# Patient Record
Sex: Female | Born: 1945 | Race: White | Hispanic: No | Marital: Married | Smoking: Never smoker
Health system: Southern US, Community
[De-identification: ages and names within clinical notes are randomized; demographics above are authoritative.]

## PROBLEM LIST (undated history)

## (undated) DIAGNOSIS — F32A Depression, unspecified: Secondary | ICD-10-CM

## (undated) DIAGNOSIS — F419 Anxiety disorder, unspecified: Secondary | ICD-10-CM

## (undated) DIAGNOSIS — T4145XA Adverse effect of unspecified anesthetic, initial encounter: Secondary | ICD-10-CM

## (undated) DIAGNOSIS — C801 Malignant (primary) neoplasm, unspecified: Secondary | ICD-10-CM

## (undated) DIAGNOSIS — T8859XA Other complications of anesthesia, initial encounter: Secondary | ICD-10-CM

## (undated) DIAGNOSIS — R519 Headache, unspecified: Secondary | ICD-10-CM

## (undated) DIAGNOSIS — J449 Chronic obstructive pulmonary disease, unspecified: Secondary | ICD-10-CM

## (undated) DIAGNOSIS — E039 Hypothyroidism, unspecified: Secondary | ICD-10-CM

## (undated) DIAGNOSIS — R112 Nausea with vomiting, unspecified: Secondary | ICD-10-CM

## (undated) DIAGNOSIS — M1990 Unspecified osteoarthritis, unspecified site: Secondary | ICD-10-CM

## (undated) DIAGNOSIS — G4730 Sleep apnea, unspecified: Secondary | ICD-10-CM

## (undated) DIAGNOSIS — I10 Essential (primary) hypertension: Secondary | ICD-10-CM

## (undated) DIAGNOSIS — L409 Psoriasis, unspecified: Secondary | ICD-10-CM

## (undated) DIAGNOSIS — F329 Major depressive disorder, single episode, unspecified: Secondary | ICD-10-CM

## (undated) DIAGNOSIS — Z9889 Other specified postprocedural states: Secondary | ICD-10-CM

## (undated) DIAGNOSIS — I499 Cardiac arrhythmia, unspecified: Secondary | ICD-10-CM

## (undated) DIAGNOSIS — Z22322 Carrier or suspected carrier of Methicillin resistant Staphylococcus aureus: Secondary | ICD-10-CM

## (undated) HISTORY — PX: BREAST EXCISIONAL BIOPSY: SUR124

## (undated) HISTORY — PX: CHOLECYSTECTOMY: SHX55

## (undated) HISTORY — PX: EYE SURGERY: SHX253

---
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
## 1968-02-21 HISTORY — PX: BREAST SURGERY: SHX581

## 2003-02-21 HISTORY — PX: JOINT REPLACEMENT: SHX530

## 2003-11-30 ENCOUNTER — Other Ambulatory Visit: Payer: Self-pay

## 2003-12-16 ENCOUNTER — Inpatient Hospital Stay: Payer: Self-pay | Admitting: General Practice

## 2004-05-03 ENCOUNTER — Ambulatory Visit: Payer: Self-pay | Admitting: Psychiatry

## 2004-05-16 ENCOUNTER — Ambulatory Visit: Payer: Self-pay | Admitting: Psychiatry

## 2004-07-03 ENCOUNTER — Ambulatory Visit: Payer: Self-pay | Admitting: Psychiatry

## 2004-07-05 ENCOUNTER — Ambulatory Visit: Payer: Self-pay | Admitting: Internal Medicine

## 2005-07-11 ENCOUNTER — Ambulatory Visit: Payer: Self-pay | Admitting: Internal Medicine

## 2006-02-20 HISTORY — PX: JOINT REPLACEMENT: SHX530

## 2006-05-01 ENCOUNTER — Other Ambulatory Visit: Payer: Self-pay

## 2006-05-01 ENCOUNTER — Ambulatory Visit: Payer: Self-pay | Admitting: General Practice

## 2006-05-09 ENCOUNTER — Ambulatory Visit: Payer: Self-pay | Admitting: General Practice

## 2006-05-15 ENCOUNTER — Inpatient Hospital Stay: Payer: Self-pay | Admitting: General Practice

## 2006-08-15 ENCOUNTER — Ambulatory Visit: Payer: Self-pay | Admitting: Internal Medicine

## 2007-08-20 ENCOUNTER — Ambulatory Visit: Payer: Self-pay | Admitting: Internal Medicine

## 2007-10-24 ENCOUNTER — Ambulatory Visit: Payer: Self-pay | Admitting: Unknown Physician Specialty

## 2008-08-21 ENCOUNTER — Ambulatory Visit: Payer: Self-pay | Admitting: Internal Medicine

## 2009-10-05 ENCOUNTER — Ambulatory Visit: Payer: Self-pay | Admitting: Internal Medicine

## 2010-10-05 ENCOUNTER — Ambulatory Visit: Payer: Self-pay | Admitting: Internal Medicine

## 2010-10-12 ENCOUNTER — Ambulatory Visit: Payer: Self-pay | Admitting: Internal Medicine

## 2010-10-22 ENCOUNTER — Ambulatory Visit: Payer: Self-pay | Admitting: Internal Medicine

## 2010-11-21 ENCOUNTER — Ambulatory Visit: Payer: Self-pay | Admitting: Internal Medicine

## 2011-01-05 ENCOUNTER — Ambulatory Visit: Payer: Self-pay | Admitting: Internal Medicine

## 2011-01-21 ENCOUNTER — Ambulatory Visit: Payer: Self-pay | Admitting: Internal Medicine

## 2011-02-21 HISTORY — PX: JOINT REPLACEMENT: SHX530

## 2011-05-31 ENCOUNTER — Ambulatory Visit: Payer: Self-pay | Admitting: General Practice

## 2011-05-31 LAB — URINALYSIS, COMPLETE
Bacteria: NONE SEEN
Bilirubin,UR: NEGATIVE
Blood: NEGATIVE
Glucose,UR: NEGATIVE mg/dL (ref 0–75)
Leukocyte Esterase: NEGATIVE
Ph: 7 (ref 4.5–8.0)
Protein: NEGATIVE
Specific Gravity: 1.016 (ref 1.003–1.030)
Squamous Epithelial: <1
WBC UR: 1 /HPF (ref 0–5)

## 2011-05-31 LAB — BASIC METABOLIC PANEL
BUN: 21 mg/dL — ABNORMAL HIGH (ref 7–18)
Calcium, Total: 9.2 mg/dL (ref 8.5–10.1)
Chloride: 105 mmol/L (ref 98–107)
Creatinine: 0.7 mg/dL (ref 0.60–1.30)
EGFR (African American): >60
EGFR (Non-African Amer.): >60
Osmolality: 289 (ref 275–301)
Potassium: 3.6 mmol/L (ref 3.5–5.1)
Sodium: 144 mmol/L (ref 136–145)

## 2011-05-31 LAB — CBC
HCT: 35.2 % (ref 35.0–47.0)
HGB: 11.7 g/dL — ABNORMAL LOW (ref 12.0–16.0)
MCH: 31.2 pg (ref 26.0–34.0)
RBC: 3.76 10*6/uL — ABNORMAL LOW (ref 3.80–5.20)
RDW: 14.4 % (ref 11.5–14.5)
WBC: 8.5 10*3/uL (ref 3.6–11.0)

## 2011-05-31 LAB — APTT: Activated PTT: 29.7 secs (ref 23.6–35.9)

## 2011-05-31 LAB — PROTIME-INR
INR: 0.9
Prothrombin Time: 12.4 secs (ref 11.5–14.7)

## 2011-06-01 LAB — URINE CULTURE

## 2011-06-14 ENCOUNTER — Inpatient Hospital Stay: Payer: Self-pay | Admitting: General Practice

## 2011-06-15 LAB — BASIC METABOLIC PANEL
Calcium, Total: 8.2 mg/dL — ABNORMAL LOW (ref 8.5–10.1)
Creatinine: 0.64 mg/dL (ref 0.60–1.30)
EGFR (African American): >60
Potassium: 3.8 mmol/L (ref 3.5–5.1)

## 2011-06-15 LAB — HEMOGLOBIN: HGB: 11.1 g/dL — ABNORMAL LOW (ref 12.0–16.0)

## 2011-06-15 LAB — PLATELET COUNT: Platelet: 191 10*3/uL (ref 150–440)

## 2011-06-16 LAB — BASIC METABOLIC PANEL
Calcium, Total: 8.9 mg/dL (ref 8.5–10.1)
Creatinine: 0.64 mg/dL (ref 0.60–1.30)
EGFR (African American): >60
Osmolality: 273 (ref 275–301)
Sodium: 136 mmol/L (ref 136–145)

## 2011-06-16 LAB — HEMOGLOBIN: HGB: 10.6 g/dL — ABNORMAL LOW (ref 12.0–16.0)

## 2011-06-17 LAB — BASIC METABOLIC PANEL
BUN: 17 mg/dL (ref 7–18)
Calcium, Total: 8.5 mg/dL (ref 8.5–10.1)
Chloride: 100 mmol/L (ref 98–107)
Co2: 31 mmol/L (ref 21–32)
Creatinine: 0.57 mg/dL — ABNORMAL LOW (ref 0.60–1.30)
EGFR (African American): >60
Glucose: 121 mg/dL — ABNORMAL HIGH (ref 65–99)
Potassium: 3.5 mmol/L (ref 3.5–5.1)
Sodium: 138 mmol/L (ref 136–145)

## 2011-10-17 ENCOUNTER — Ambulatory Visit: Payer: Self-pay | Admitting: Internal Medicine

## 2012-10-17 ENCOUNTER — Ambulatory Visit: Payer: Self-pay | Admitting: Internal Medicine

## 2013-01-02 ENCOUNTER — Ambulatory Visit: Payer: Self-pay | Admitting: Unknown Physician Specialty

## 2013-01-03 LAB — PATHOLOGY REPORT

## 2013-07-28 DIAGNOSIS — I10 Essential (primary) hypertension: Secondary | ICD-10-CM | POA: Insufficient documentation

## 2013-07-28 DIAGNOSIS — G4730 Sleep apnea, unspecified: Secondary | ICD-10-CM | POA: Insufficient documentation

## 2013-10-23 ENCOUNTER — Ambulatory Visit: Payer: Self-pay | Admitting: Internal Medicine

## 2013-10-28 ENCOUNTER — Ambulatory Visit: Payer: Self-pay | Admitting: Internal Medicine

## 2013-12-25 DIAGNOSIS — E039 Hypothyroidism, unspecified: Secondary | ICD-10-CM | POA: Insufficient documentation

## 2013-12-25 DIAGNOSIS — E785 Hyperlipidemia, unspecified: Secondary | ICD-10-CM | POA: Insufficient documentation

## 2013-12-25 DIAGNOSIS — L409 Psoriasis, unspecified: Secondary | ICD-10-CM | POA: Insufficient documentation

## 2014-04-01 ENCOUNTER — Ambulatory Visit: Payer: Self-pay | Admitting: Ophthalmology

## 2014-04-29 ENCOUNTER — Ambulatory Visit: Payer: Self-pay | Admitting: Ophthalmology

## 2014-06-14 NOTE — Consult Note (Signed)
PATIENT NAME:  Victoria Flynn, Victoria Flynn MR#:  [id number] DATE OF BIRTH:  1945-06-28  DATE OF CONSULTATION:  06/15/2011  REFERRING PHYSICIAN:   CONSULTING PHYSICIAN:  Tana Conch. Leslye Peer, MD  PRIMARY CARE PHYSICIAN: Lisette Grinder, III, MD   ORTHOPEDIC SURGEON: Dr. Marry Guan   REASON FOR CONSULTATION: Malignant hypertension.   HISTORY OF PRESENT ILLNESS: This is a 69 year old female who underwent elective right total knee replacement on 06/14/2011. The patient did well with the surgery, really does not have much pain when I came to see her but, as per the nurse, did have some pain earlier. She did have nausea and vomiting last night and again this morning. The nurse held the blood pressure medications while she was nauseated and vomiting until that settled down and did give the blood pressure medications. Blood pressure was up at 204/90 and after the blood pressure medications 190/86. The patient thinks that her blood pressure went up with the activity with physical therapy. Of note, she was recently placed on Norvasc for blood pressure elevation at the preop consultation and the patient is already on lisinopril, HCT, and atenolol.   PAST MEDICAL HISTORY:  1. Hypertension.  2. Hypothyroidism.  3. Arthritis.  4. Sleep apnea. 5. Psoriasis.  6. Depression.   PAST SURGICAL HISTORY:  1. Cholecystectomy.  2. Bilateral hip replacements. 3. Skin cancer on the chest. 4. Yesterday's right knee replacement.   ALLERGIES: Morphine causes nausea and vomiting and numerous fruits.   MEDICATIONS THAT THE PATIENT IS TAKING ON A DAILY BASIS AS OUTPATIENT:  1. Aspirin 81 mg daily.  2. Atenolol 50 mg daily.  3. Celexa 10 mg daily.  4. Doxazosin 4 mg daily.  5. Enbrel 50 mg/mL one shot subcutaneous once a week. 6. Hydrochlorothiazide/lisinopril 12.5/20 two tablets daily.  7. Levoxyl 150 mcg daily.  8. Melatonin 3 mg 2 to 3 tablets at night. 9. Meloxicam 15 mg at bedtime.  10. Multivitamin daily.  11. Norvasc 5 mg  daily.  12. Os-Cal Plus D 1 mg b.i.d.  13. Protopic 0.1% topical ointment twice a day to eyelids. 14. Taclonex topical ointment 2 to 3 times per week as needed.   MEDICATIONS IN THE HOSPITAL:  1. Tylenol 500 to 1000 mg q.4 hours p.r.n. pain.  2. Antacid double-strength 30 mL q.6 hours p.r.n.  3. Atenolol 50 mg daily. 4. Bisacodyl 10 mg rectally.  5. Calcium and Vitamin D twice a day.  6. Celebrex 200 mg twice a day.  7. Celexa 10 mg daily.  8. Senokot S 1 tablet b.i.d.  9. Anzemet 12.5 mg q.6 hours p.r.n. nausea. 10. Cardura 4 mg daily.  11. Hydrochlorothiazide/lisinopril 12.5/20 2 tablets daily.  12. Levothyroxine 0.15 mg q.6 a.m.  13. Milk of Magnesia 30 mL b.i.d. p.r.n. constipation. 14. Melatonin 6 mg at bedtime.  15. Demerol 25 to 50 mg IV push q.4 hours p.r.n. pain. 16. Reglan 10 mg q.i.d.  17. Multivitamin daily.  18. Taclonex ointment. 19. Oxycodone 5 to 10 mg q.4 hours p.r.n. pain. 20. Protonix 40 mg b.i.d.  21. Tramadol 50 to 100 mg q.4 hours p.r.n. pain. 22. Lovenox 30 mg q.12 hours. 23. Norvasc 5 mg daily.   SOCIAL HISTORY: No smoking. No alcohol. No drug use. Lives with her husband. 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  FAMILY HISTORY: Father died at 91 of hypertensive complications. Mother died at 61 after a fracture of the hip, had pneumonia and heart failure. She also had hypertension. Sister living with  hypertension and hyperlipidemia.   REVIEW OF SYSTEMS: CONSTITUTIONAL: Positive for fatigue. No fever, chills, or sweats. No weight loss. No weight gain. EYES: She does wear glasses. EARS, NOSE, MOUTH, AND THROAT: Positive for sinus trouble. No sore throat. No difficulty swallowing. CARDIOVASCULAR: No chest pain. Occasional palpitations. RESPIRATORY: No shortness of breath. No cough. No sputum. No hemoptysis. GASTROINTESTINAL: Positive for nausea. Positive for vomiting. No hematemesis. No abdominal pain. No bowel movement since the day prior to surgery. No  bright red blood per rectum. GENITOURINARY: Has a catheter in. No hematuria. MUSCULOSKELETAL: Positive for some right knee pain. INTEGUMENTARY: History of psoriasis. NEUROLOGIC: No fainting or blackouts. PSYCHIATRIC: On medication for depression and anxiety. ENDOCRINE: Positive for hypothyroidism. HEMATOLOGIC/LYMPHATIC: History of anemia.   PHYSICAL EXAMINATION:   VITAL SIGNS: Blood pressure 201/90, respirations 18, pulse 70, temperature 98.1, room air oxygen saturation 92%.   GENERAL: No respiratory distress.   EYES: Conjunctivae and lids normal. Pupils equal, round, and reactive to light. Extraocular muscles intact. No nystagmus.   EARS, NOSE, MOUTH, AND THROAT: Tympanic membrane not tested. Nasal mucosa no erythema. Throat no erythema. No exudate seen. Lips and gums no lesions.   NECK: No JVD. No bruits. No lymphadenopathy. No thyromegaly. No thyroid nodules palpated.   RESPIRATORY: Lungs clear to auscultation. No use of accessory muscles to breathe. No rhonchi, rales, or wheeze heard.   CARDIOVASCULAR: S1, S2 normal. No gallops, rubs, or murmurs heard. Carotid upstroke 2+ bilaterally. No bruits.   EXTREMITIES: Dorsalis pedis pulses 2+ bilaterally. Trace edema to lower extremity.   ABDOMEN: Soft, nontender. No organomegaly/splenomegaly. Normoactive bowel sounds. No masses felt.   LYMPHATIC: No lymph nodes in the neck.   MUSCULOSKELETAL: No clubbing. No cyanosis. Trace edema.   SKIN: Unable to examine fully secondary to right knee being bandaged and covered.   NEUROLOGIC: Cranial nerves II through XII grossly intact. Did not test deep tendon reflexes with recent surgery.   PSYCHIATRIC: The patient is oriented to person, place, and time.   LABORATORY AND RADIOLOGICAL DATA: Hemoglobin 11.1, glucose 141, BUN 18, creatinine 0.64, sodium 141, potassium 3.8, chloride 106, CO2 25, calcium 8.2, platelet count 191.   ASSESSMENT AND PLAN:  1. Malignant hypertension. The patient did have  nausea and vomiting this a.m. Waited to give meds. Repeat blood pressure after the meds were given was still high. Will give a stat dose of Norvasc 5 mg p.o. now and increase to 10 mg p.o. daily. Will continue the atenolol, lisinopril/HCT, and the Cardura. Will continue to monitor blood pressure closely. May need further titration of meds. P.r.n. hydralazine given for systolic blood pressure greater than 180. The patient did have elevated blood pressure at the preop consultation also.  2. Nausea and vomiting. The patient is allergic to morphine. The patient is on Demerol for pain, could cause the same thing. Need to watch closely. Hopefully the patient will not need too much IV meds.  3. Hypothyroidism. Continue levothyroxine.  4. Constipation. Continue the stool softeners.  5. Sleep apnea. Continue CPAP.  6. Status post right total knee replacement. Continue pain control and physical therapy.  7. Depression and anxiety. Continue Celexa.  8. The patient is a DO NOT RESUSCITATE.  9. Impaired fasting glucose. Will check a hemoglobin A1c in the a.m.   TIME SPENT ON CONSULTATION: 50 minutes.   ____________________________ Tana Conch. Leslye Peer, MD [id number] D: 06/15/2011 17:02:57 ET T: 06/15/2011 17:52:28 ET JOB#: [id number]  cc: Tana Conch. Leslye Peer, MD, <Dictator> John B. Sarina Ser, MD  James P. Holley Bouche., MD Marisue Brooklyn MD ELECTRONICALLY SIGNED 06/18/2011 13:56

## 2014-06-14 NOTE — Consult Note (Signed)
1. Malignant HTNhad nausea and vomitng this am, they waited to give meds, repeat BP after meds still highwill give stat norvasc 5mg  po now and increase to 10mg  po dailycontinue atenolol, lisinopril hct and carduracontniue to monitor bp closely, may need further titration of medsnausea and vomitingpatient allergic to morphine, on demerol for pain, could cause the same thinghypothyroidism- levothyroxineconstipation- stool softenersleep apnea- cpaps/p right total knee replacement- pain control, ptdepression/ anxiety- celexadnr [employer] md  Electronic Signatures: Loletha Grayer (MD)  (Signed on 25-Apr-13 13:20)  Authored  Last Updated: 25-Apr-13 13:20 by Loletha Grayer (MD)

## 2014-06-14 NOTE — Op Note (Signed)
PATIENT NAME:  Victoria Flynn, RATHER MR#:  [id number] DATE OF BIRTH:  [birth year]  DATE OF PROCEDURE:  06/14/2011  PREOPERATIVE DIAGNOSIS: Degenerative arthrosis of the right knee.   POSTOPERATIVE DIAGNOSIS: Degenerative arthrosis of the right knee.   PROCEDURE PERFORMED: Right total knee arthroplasty using computer-assisted navigation.   SURGEON: Laurice Record. Holley Bouche., MD   ASSISTANT: Vance Peper, PA-C (required to maintain retraction throughout the procedure)   ANESTHESIA: Femoral nerve block and spinal.   ESTIMATED BLOOD LOSS: 100 mL.   FLUIDS REPLACED: 1700 mL of Crystalloid.   TOURNIQUET TIME: 76 minutes.   DRAINS: Two medium drains to reinfusion system.   SOFT TISSUE RELEASES: Anterior cruciate ligament, posterior cruciate ligament, deep medial collateral ligament, and patellofemoral ligament.   IMPLANTS UTILIZED: DePuy PFC Sigma size 3 posterior stabilized femoral component (cemented), size 3 MBT tibial component (cemented), 32 mm three peg oval dome patella (cemented), and a 10 mm stabilized rotating platform polyethylene insert.   INDICATIONS FOR SURGERY: The patient is a 69 year old female who has been seen for complaints of progressive right knee pain. X-rays demonstrated severe degenerative changes in tricompartmental fashion with relative varus deformity. After discussion of the risks and benefits of surgical intervention, the patient expressed her understanding of the risks and benefits and agreed with plans for surgical intervention.   PROCEDURE IN DETAIL: The patient was brought into the operating room and, after adequate femoral nerve block and spinal anesthesia was achieved, a tourniquet was placed on the patient's upper right thigh. The patient's right knee and leg were cleaned and prepped with alcohol and DuraPrep and draped in the usual sterile fashion. A "time-out" was performed as per usual protocol. The right lower extremity was exsanguinated using an Esmarch and the tourniquet  was inflated to 300 mmHg. Anterior longitudinal incision was made followed by a standard mid vastus approach. A large effusion was evacuated. The deep fibers of the medial collateral ligament were elevated in a subperiosteal fashion off the medial flare of the tibia so as to maintain a continuous soft tissue sleeve. The patella was subluxed laterally and patellofemoral ligament was incised. Inspection of the knee demonstrated degenerative changes in a tricompartmental fashion with most severe changes noted to the medial compartment. Prominent osteophytes were debrided using a rongeur. Anterior and posterior cruciate ligaments were excised. Two 4.0 mm Schanz pins were inserted into the femur and into the tibia for attachment of the array of spheres used for computer-assisted navigation. Hip center was identified using circumduction technique. Distal landmarks were mapped using the computer. Distal femur and proximal tibia were mapped using the computer. Distal femoral cutting guide was positioned using computer-assisted navigation so as to achieve 5 degree distal valgus cut. Cut was performed and verified using the computer. Distal femur was then sized and it was felt that a size 3 femoral component was appropriate. Size 3 cutting guide was positioned using computer-assisted navigation and the anterior cut was performed and verified using the computer. This was followed by completion of the posterior and chamfer cuts. Femoral cutting guide for central box was then positioned and central box cut was performed.   Attention was then directed to the proximal tibia. Medial and lateral menisci were excised. The extramedullary tibial cutting guide was positioned using computer-assisted navigation so as to achieve a 0 degree varus valgus alignment and 0 degree posterior slope. Cut was performed and verified using the computer. Proximal tibia was sized and it was felt that a size 3 tibial tray was appropriate.  Tibial and  femoral trials were inserted followed by insertion of a 10 mm polyethylene insert. Excellent mediolateral soft tissue balancing was appreciated both in full extension and in 90 degrees of flexion. Finally, the patella was cut and prepared so as to accommodate a 32 mm three peg oval dome patella. Patellar trial was placed and the knee was placed through a range of motion with excellent patellar tracking appreciated.   Femoral trial was removed. Central post hole for the tibial component was reamed followed by insertion of a keel punch. Tibial trials were then removed. Cut surfaces of bone were irrigated with copious amounts of normal saline with antibiotic solution using pulsatile lavage and then suctioned dry. Polymethyl methacrylate cement with gentamicin was mixed in the usual fashion using a vacuum mixer. Cement was applied to the cut surface of the proximal tibia as well as along the undersurface of a size 3 MBT tibial component. Tibial component was positioned and impacted into place. Excess cement was removed using freer elevators. Cement was then applied to the cut surface of the femur as well as along the posterior flanges of a size 3 posterior stabilized femoral component. Femoral component was positioned and impacted in place. Excess cement was removed using freer elevators. A 10 mm polyethylene trial was inserted and the knee was brought in full extension with steady axial compression applied. Finally, cement was applied to the backside of a 32 mm three peg oval dome patella and patellar component was positioned and patellar clamp applied. Excess cement was removed using freer elevators.   After adequate curing of cement, tourniquet was deflated after total tourniquet time of 76 minutes. Hemostasis was achieved using electrocautery. The knee was irrigated with copious amounts of normal saline with antibiotic solution using pulsatile lavage and then suctioned dry. The knee was inspected for any  residual cement debris. 30 mL of 0.25% Marcaine with epinephrine was injected along the posterior capsule. A 10 mm stabilized rotating platform polyethylene insert was inserted and the knee was placed through a range of motion. Excellent mediolateral soft tissue balancing was appreciated both in full extension and in 90 degrees of flexion. Excellent patellar tracking was appreciated.   Two medium drains were placed in the wound bed and brought out through a separate stab incision to be attached to a reinfusion system. The medial parapatellar portion of the incision was reapproximated using interrupted sutures of #1 Vicryl. Subcutaneous tissue was approximated in layers using first #0 Vicryl followed by #2-0 Vicryl. Skin was closed skin staples. Sterile dressing was applied.   The patient tolerated the procedure well. She was transported to the recovery room in stable condition.   ____________________________ Laurice Record. Holley Bouche., MD jph:drc D: 06/14/2011 22:21:17 ET T: 06/15/2011 10:24:02 ET JOB#: [id number]  cc: Laurice Record. Holley Bouche., MD, <Dictator> Laurice Record Holley Bouche MD ELECTRONICALLY SIGNED 06/15/2011 19:10

## 2014-06-14 NOTE — Discharge Summary (Signed)
PATIENT NAME:  Victoria Flynn, LAUGHERY MR#:  [id number] DATE OF BIRTH:  1945-03-01  DATE OF ADMISSION:  06/14/2011 DATE OF DISCHARGE:  06/18/2011  ADMITTING DIAGNOSIS: Degenerative arthrosis of right knee.   DISCHARGE DIAGNOSIS: Degenerative arthrosis of right knee.   HISTORY: The patient is a 69 year old who has been followed at [facility] for progression of her right knee pain. She reported significant right knee pain mainly to the medial aspect of the knee. The pain was noted to be aggravated with weightbearing activities. She had denied any swelling, locking, or giving way of the knee. She did report some start up stiffness. She had gone to using a cane prior to ambulation secondary to the pain. The patient did not see any significant improvement in her condition despite use of anti-inflammatory medications, Synvisc injections as well as corticosteroid injections. The pain had progressed to the point that it was significantly interfering with her activities of daily living. X-rays taken in the clinic showed narrowing of the medial cartilage space with associated sclerosis. Some narrowing of the patellofemoral articulation was also noted. After discussion of the risks and benefits of surgical intervention, the patient expressed her understanding of the risks and benefits and agreed for plans for surgical intervention.   PROCEDURE: Right total knee arthroplasty using computer-assisted navigation.   ANESTHESIA: Femoral nerve block with spinal.   SOFT TISSUE RELEASE: Anterior cruciate ligament, posterior cruciate ligament, deep medial collateral ligaments as well as the patellofemoral ligament.   IMPLANTS UTILIZED: DePuy PFC Sigma size 3 posterior stabilized femoral component (cemented), size 3 MBT tibial component (cemented), 32 mm three pegged oval dome patella (cemented), and a 10 mm stabilized rotating platform polyethylene insert.   HOSPITAL COURSE: The patient tolerated the procedure very well. She  had no complications. She was then taken to PAC-U where she was stabilized and then transferred to the orthopedic floor. The patient began receiving anticoagulation therapy of Lovenox 30 mg sub-Q q.12 hours per anesthesia protocol. She was fitted with TED stockings bilaterally. These were allowed to be removed one hour per eight hour shift. The right one was applied on day two following removal of the Hemovac and dressing change. The patient was also fitted with the AV-I compression foot pumps set at 80 mmHg bilaterally. Her calves have been nontender, free of any evidence of any deep venous thromboses. Negative Homans sign. Her heels were elevated off the bed using rolled towels. She had voiced no complaints.   Vital signs have been stable. She has been afebrile. Hemodynamically she was stable. No transfusions were needed. She was given Autovac transfusions for six hours postoperatively. She has denied chest pains or shortness of breath.   Physical therapy was initiated on day one for gait training and transfers. Initially the patient was extremely slow and felt that rehab may be her best benefit and subsequently a bed search was extended. However, on day of three the patient did see significant improvement and upon being discharged was ambulating greater than 350 feet. She was able go up and down four sets of steps. She was independent with bed-to-chair transfers. Occupational therapy was also initiated on day one for activities of daily living and assistive devices.   DISPOSITION: The patient is being discharged to home in improved stable condition.   DISCHARGE INSTRUCTIONS:  1. She may weight bear as tolerated.  2. Continue using a walker until cleared by physical therapy to go to a quad cane.  3. She will receive home health PT.  4. She is to continue using stockings. These are to be worn during the day but may be removed at night.  5. She was instructed on wound care.  6. She is placed on a  regular diet.  7. She is to continue using a Polar Care maintaining a temperature of 40 to 50 degrees Fahrenheit. Recommend that she use this around-the-clock as much as possible until she is seen in the office in two weeks. She does have a follow-up appointment on May 9th at 8:15. She is to call the clinic sooner if any temperatures of 101.5 or greater or excessive bleeding.  8. She is to resume her regular medication that she was on prior to admission.  9. She was given a prescription for Lovenox 40 mg sub-Q daily for 14 days, then discontinue and begin taking one 81 mg enteric-coated aspirin. She was also given a prescription for oxycodone 5 to 10 mg q.4 hours p.r.n. for pain.   PAST MEDICAL HISTORY:  1. Arthritis.  2. Borderline anemia.  3. Depression.  4. Hyperlipidemia.  5. Hypertension. 6. Hypothyroidism. 7. Obesity.  8. Psoriasis.  9. Sleep apnea.  10. Thyroid disease.   ____________________________ Vance Peper, PA [id number] D: 06/18/2011 08:13:31 ET T: 06/19/2011 11:23:33 ET JOB#: [id number]  cc: Vance Peper, PA, <Dictator> Damauri Minion PA ELECTRONICALLY SIGNED 06/21/2011 10:47

## 2014-10-03 ENCOUNTER — Emergency Department: Payer: Medicare Other

## 2014-10-03 ENCOUNTER — Encounter: Payer: Self-pay | Admitting: Intensive Care

## 2014-10-03 ENCOUNTER — Emergency Department
Admission: EM | Admit: 2014-10-03 | Discharge: 2014-10-03 | Disposition: A | Discharge status: Home / Self Care | Payer: Medicare Other | Attending: Emergency Medicine | Admitting: Emergency Medicine

## 2014-10-03 DIAGNOSIS — I10 Essential (primary) hypertension: Secondary | ICD-10-CM | POA: Diagnosis not present

## 2014-10-03 DIAGNOSIS — M79662 Pain in left lower leg: Secondary | ICD-10-CM | POA: Diagnosis present

## 2014-10-03 DIAGNOSIS — M7662 Achilles tendinitis, left leg: Secondary | ICD-10-CM

## 2014-10-03 HISTORY — DX: Hypothyroidism, unspecified: E03.9

## 2014-10-03 HISTORY — DX: Essential (primary) hypertension: I10

## 2014-10-03 MED ORDER — ACETAMINOPHEN 500 MG PO TABS
1000.0000 mg | ORAL_TABLET | Freq: Once | ORAL | Status: AC
  Administered 2014-10-03: 1000 mg via ORAL

## 2014-10-03 MED ORDER — HYDROCODONE-ACETAMINOPHEN 5-325 MG PO TABS
1.0000 | ORAL_TABLET | ORAL | Status: DC | PRN
Start: 2014-10-03 — End: 2018-09-13

## 2014-10-03 MED ORDER — ACETAMINOPHEN 500 MG PO TABS
ORAL_TABLET | ORAL | Status: AC
  Administered 2014-10-03: 1000 mg via ORAL
  Filled 2014-10-03: qty 2

## 2014-10-03 MED ORDER — HYDROCODONE-ACETAMINOPHEN 5-325 MG PO TABS
1.0000 | ORAL_TABLET | Freq: Once | ORAL | Status: AC
  Administered 2014-10-03: 1 via ORAL
  Filled 2014-10-03: qty 1

## 2014-10-03 NOTE — ED Provider Notes (Signed)
[facility] Emergency Department Provider Note  ____________________________________________  Time seen: On arrival  I have reviewed the triage vital signs and the nursing notes.   HISTORY  Chief Complaint Leg Pain    HPI ZENIA GUEST is a 69 y.o. female who presents with left calf pain which started yesterday. She reports she was driving her manual transmission car and developed pain in the left posterior inferior lower leg. She denies a history of blood clots. No recent travel. No fevers no chills. No erythema. No trauma to the area.     Past Medical History  Diagnosis Date  . Hypertension   . Acquired underactive thyroid     There are no active problems to display for this patient.   History reviewed. No pertinent past surgical history.  No current outpatient prescriptions on file.  Allergies Morphine and related  History reviewed. No pertinent family history.  Social History Social History  Substance Use Topics  . Smoking status: Never Smoker   . Smokeless tobacco: Never Used  . Alcohol Use: No    Review of Systems  Constitutional: Negative for fever. Eyes: Negative for visual changes. ENT: Negative for sore throat Cardiovascular: Negative for chest pain. Respiratory: Negative for shortness of breath. Gastrointestinal: Negative for abdominal pain, vomiting and diarrhea. Genitourinary: Negative for dysuria. Musculoskeletal: Negative for back pain.  Skin: Negative for rash. Neurological: Negative for headaches      ____________________________________________   PHYSICAL EXAM:  VITAL SIGNS: ED Triage Vitals  Enc Vitals Group     BP 10/03/14 1012 138/76 mmHg     Pulse Rate 10/03/14 1012 66     Resp 10/03/14 1012 16     Temp 10/03/14 1012 98.2 F (36.8 C)     Temp Source 10/03/14 1012 Oral     SpO2 10/03/14 1012 95 %     Weight 10/03/14 1012 205 lb (92.987 kg)     Height 10/03/14 1012 5' 2.5" (1.588 m)     Head Cir  --      Peak Flow --      Pain Score 10/03/14 1013 10     Pain Loc --      Pain Edu? --      Excl. in [place]? --      Constitutional: Alert and oriented. Well appearing and in no distress. Pleasant and interactive  Eyes: Conjunctivae are normal.  ENT   Head: Normocephalic and atraumatic.   Mouth/Throat: Mucous membranes are moist. Cardiovascular: Normal rate, regular rhythm. Normal and symmetric distal pulses are present in all extremities.  Respiratory: Normal respiratory effort without tachypnea nor retractions.  Gastrointestinal: Soft and non-tender in all quadrants. No distention. There is no CVA tenderness. Genitourinary: deferred Musculoskeletal: Nontender with normal range of motion in all extremities. No lower extremity tenderness nor edema. 2+ distal pulses. Feet are warm and well perfused. Patient has point tenderness to the left posterior inferior calf just above the beginning of the Achilles tendon Neurologic:  Normal speech and language. No gross focal neurologic deficits are appreciated. Skin:  Skin is warm, dry and intact. No rash noted. Psychiatric: Mood and affect are normal. Patient exhibits appropriate insight and judgment.  ____________________________________________    LABS (pertinent positives/negatives)  Labs Reviewed - No data to display  ____________________________________________   EKG  None  ____________________________________________    RADIOLOGY I have personally reviewed any xrays that were ordered on this patient: Ultrasound of left leg shows no DVT  ____________________________________________   PROCEDURES  Procedure(s) performed: none  Critical Care performed:none  ____________________________________________   INITIAL IMPRESSION / ASSESSMENT AND PLAN / ED COURSE  Pertinent labs & imaging results that were available during my care of the patient were reviewed by me and considered in my medical decision making (see chart  for details).  We will obtain ultrasound of the left lower extremity to evaluate for DVT. However I suspect this is a muscular skeletal injury  ----------------------------------------- 1:51 PM on 10/03/2014 -----------------------------------------  Ultrasound negative. We have arranged for a boot for the patient, she will use a walker and follow-up with orthopedics this week  ____________________________________________   FINAL CLINICAL IMPRESSION(S) / ED DIAGNOSES  Final diagnoses:  Achilles tendinitis of left lower extremity     Lavonia Drafts, MD 10/03/14 1351

## 2014-10-03 NOTE — Discharge Instructions (Signed)
Achilles Tendinitis Achilles tendinitis is inflammation of the tough, cord-like band that attaches the lower muscles of your leg to your heel (Achilles tendon). It is usually caused by overusing the tendon and joint involved.  CAUSES Achilles tendinitis can happen because of:  A sudden increase in exercise or activity (such as running).  Doing the same exercises or activities (such as jumping) over and over.  Not warming up calf muscles before exercising.  Exercising in shoes that are worn out or not made for exercise.  Having arthritis or a bone growth on the back of the heel bone. This can rub against the tendon and hurt the tendon. SIGNS AND SYMPTOMS The most common symptoms are:  Pain in the back of the leg, just above the heel. The pain usually gets worse with exercise and better with rest.  Stiffness or soreness in the back of the leg, especially in the morning.  Swelling of the skin over the Achilles tendon.  Trouble standing on tiptoe. Sometimes, an Achilles tendon tears (ruptures). Symptoms of an Achilles tendon rupture can include:  Sudden, severe pain in the back of the leg.  Trouble putting weight on the foot or walking normally. DIAGNOSIS Achilles tendinitis will be diagnosed based on symptoms and a physical examination. An X-ray may be done to check if another condition is causing your symptoms. An MRI may be ordered if your health care provider suspects you may have completely torn your tendon, which is called an Achilles tendon rupture.  TREATMENT  Achilles tendinitis usually gets better over time. It can take weeks to months to heal completely. Treatment focuses on treating the symptoms and helping the injury heal. HOME CARE INSTRUCTIONS   Rest your Achilles tendon and avoid activities that cause pain.  Apply ice to the injured area:  Put ice in a plastic bag.  Place a towel between your skin and the bag.  Leave the ice on for 20 minutes, 2-3 times a  day  Try to avoid using the tendon (other than gentle range of motion) while the tendon is painful. Do not resume use until instructed by your health care provider. Then begin use gradually. Do not increase use to the point of pain. If pain does develop, decrease use and continue the above measures. Gradually increase activities that do not cause discomfort until you achieve normal use.  Do exercises to make your calf muscles stronger and more flexible. Your health care provider or physical therapist can recommend exercises for you to do.  Wrap your ankle with an elastic bandage or other wrap. This can help keep your tendon from moving too much. Your health care provider will show you how to wrap your ankle correctly.  Only take over-the-counter or prescription medicines for pain, discomfort, or fever as directed by your health care provider. SEEK MEDICAL CARE IF:   Your pain and swelling increase or pain is uncontrolled with medicines.  You develop new, unexplained symptoms or your symptoms get worse.  You are unable to move your toes or foot.  You develop warmth and swelling in your foot.  You have an unexplained temperature. MAKE SURE YOU:   Understand these instructions.  Will watch your condition.  Will get help right away if you are not doing well or get worse. Document Released: 11/16/2004 Document Revised: 11/27/2012 Document Reviewed: 09/18/2012 ExitCare Patient Information 2015 [employer]. This information is not intended to replace advice given to you by your health care provider. Make sure you discuss   any questions you have with your health care provider.  

## 2014-10-03 NOTE — ED Notes (Signed)
Patient c/o Left ankle/ calf pain. Positive homan's

## 2014-12-02 ENCOUNTER — Other Ambulatory Visit: Payer: Self-pay | Admitting: Internal Medicine

## 2014-12-02 DIAGNOSIS — Z1231 Encounter for screening mammogram for malignant neoplasm of breast: Secondary | ICD-10-CM

## 2014-12-11 ENCOUNTER — Other Ambulatory Visit: Payer: Self-pay | Admitting: Internal Medicine

## 2014-12-11 ENCOUNTER — Ambulatory Visit
Admission: RE | Admit: 2014-12-11 | Discharge: 2014-12-11 | Disposition: A | Discharge status: Home / Self Care | Payer: Medicare Other | Source: Ambulatory Visit | Attending: Internal Medicine | Admitting: Internal Medicine

## 2014-12-11 DIAGNOSIS — Z1231 Encounter for screening mammogram for malignant neoplasm of breast: Secondary | ICD-10-CM

## 2015-05-21 DIAGNOSIS — F3342 Major depressive disorder, recurrent, in full remission: Secondary | ICD-10-CM | POA: Insufficient documentation

## 2015-05-30 DIAGNOSIS — Z96651 Presence of right artificial knee joint: Secondary | ICD-10-CM | POA: Insufficient documentation

## 2015-05-30 DIAGNOSIS — Z96643 Presence of artificial hip joint, bilateral: Secondary | ICD-10-CM | POA: Insufficient documentation

## 2015-11-11 ENCOUNTER — Other Ambulatory Visit: Payer: Self-pay | Admitting: Internal Medicine

## 2015-11-11 DIAGNOSIS — Z1231 Encounter for screening mammogram for malignant neoplasm of breast: Secondary | ICD-10-CM

## 2015-12-15 ENCOUNTER — Ambulatory Visit
Admission: RE | Admit: 2015-12-15 | Discharge: 2015-12-15 | Disposition: A | Discharge status: Home / Self Care | Payer: Medicare Other | Source: Ambulatory Visit | Attending: Internal Medicine | Admitting: Internal Medicine

## 2015-12-15 DIAGNOSIS — Z1231 Encounter for screening mammogram for malignant neoplasm of breast: Secondary | ICD-10-CM | POA: Insufficient documentation

## 2016-03-14 DIAGNOSIS — M1A00X0 Idiopathic chronic gout, unspecified site, without tophus (tophi): Secondary | ICD-10-CM | POA: Insufficient documentation

## 2016-12-08 ENCOUNTER — Other Ambulatory Visit: Payer: Self-pay | Admitting: Internal Medicine

## 2016-12-08 DIAGNOSIS — Z1231 Encounter for screening mammogram for malignant neoplasm of breast: Secondary | ICD-10-CM

## 2016-12-26 ENCOUNTER — Ambulatory Visit
Admission: RE | Admit: 2016-12-26 | Discharge: 2016-12-26 | Disposition: A | Discharge status: Home / Self Care | Payer: Medicare Other | Source: Ambulatory Visit | Attending: Internal Medicine | Admitting: Internal Medicine

## 2016-12-26 DIAGNOSIS — Z1231 Encounter for screening mammogram for malignant neoplasm of breast: Secondary | ICD-10-CM | POA: Diagnosis present

## 2017-10-16 ENCOUNTER — Emergency Department: Payer: Medicare Other

## 2017-10-16 ENCOUNTER — Emergency Department
Admission: EM | Admit: 2017-10-16 | Discharge: 2017-10-16 | Disposition: A | Discharge status: Home / Self Care | Payer: Medicare Other | Attending: Emergency Medicine | Admitting: Emergency Medicine

## 2017-10-16 DIAGNOSIS — R079 Chest pain, unspecified: Secondary | ICD-10-CM | POA: Diagnosis present

## 2017-10-16 DIAGNOSIS — Z79899 Other long term (current) drug therapy: Secondary | ICD-10-CM | POA: Diagnosis not present

## 2017-10-16 DIAGNOSIS — Z7982 Long term (current) use of aspirin: Secondary | ICD-10-CM | POA: Diagnosis not present

## 2017-10-16 DIAGNOSIS — I10 Essential (primary) hypertension: Secondary | ICD-10-CM | POA: Insufficient documentation

## 2017-10-16 DIAGNOSIS — J069 Acute upper respiratory infection, unspecified: Secondary | ICD-10-CM

## 2017-10-16 HISTORY — DX: Psoriasis, unspecified: L40.9

## 2017-10-16 LAB — CBC WITH DIFFERENTIAL/PLATELET
BASOS PCT: 1 %
Basophils Absolute: 0.1 10*3/uL (ref 0–0.1)
Eosinophils Absolute: 0.4 10*3/uL (ref 0–0.7)
Eosinophils Relative: 2 %
HCT: 35.2 % (ref 35.0–47.0)
HEMOGLOBIN: 12.1 g/dL (ref 12.0–16.0)
LYMPHS ABS: 1.3 10*3/uL (ref 1.0–3.6)
Lymphocytes Relative: 7 %
MCH: 31.2 pg (ref 26.0–34.0)
MCHC: 34.5 g/dL (ref 32.0–36.0)
MCV: 90.5 fL (ref 80.0–100.0)
MONO ABS: 0.5 10*3/uL (ref 0.2–0.9)
MONOS PCT: 3 %
NEUTROS PCT: 87 %
Neutro Abs: 15.1 10*3/uL — ABNORMAL HIGH (ref 1.4–6.5)
Platelets: 228 10*3/uL (ref 150–440)
RBC: 3.89 MIL/uL (ref 3.80–5.20)
RDW: 13.6 % (ref 11.5–14.5)
WBC: 17.3 10*3/uL — ABNORMAL HIGH (ref 3.6–11.0)

## 2017-10-16 LAB — COMPREHENSIVE METABOLIC PANEL
ALK PHOS: 55 U/L (ref 38–126)
ALT: 30 U/L (ref 0–44)
AST: 64 U/L — ABNORMAL HIGH (ref 15–41)
Albumin: 4 g/dL (ref 3.5–5.0)
Anion gap: 8 (ref 5–15)
BUN: 17 mg/dL (ref 8–23)
CO2: 28 mmol/L (ref 22–32)
CREATININE: 0.67 mg/dL (ref 0.44–1.00)
Calcium: 9.1 mg/dL (ref 8.9–10.3)
Chloride: 104 mmol/L (ref 98–111)
GFR calc non Af Amer: >60 mL/min (ref >60)
Glucose, Bld: 132 mg/dL — ABNORMAL HIGH (ref 70–99)
Potassium: 3.4 mmol/L — ABNORMAL LOW (ref 3.5–5.1)
SODIUM: 140 mmol/L (ref 135–145)
Total Bilirubin: 0.7 mg/dL (ref 0.3–1.2)
Total Protein: 7 g/dL (ref 6.5–8.1)

## 2017-10-16 LAB — TROPONIN I
Troponin I: <0.03 ng/mL (ref <0.03)
Troponin I: <0.03 ng/mL (ref <0.03)

## 2017-10-16 MED ORDER — IOPAMIDOL (ISOVUE-370) INJECTION 76%
75.0000 mL | Freq: Once | INTRAVENOUS | Status: AC | PRN
  Administered 2017-10-16: 75 mL via INTRAVENOUS

## 2017-10-16 NOTE — ED Notes (Signed)
Malinda MD at bedside with update 

## 2017-10-16 NOTE — ED Triage Notes (Signed)
Pt BIB [facility] from home for chest pain radiating to in between shoulder blades x2 hours. Pt denies SOB, nausea. Pt given 4 ASA by EMS. Sugar 144. VSS en route. Currently patient denies chest pain. Upon arrival pt alert & oriented x4. ABCs intact. NAD.

## 2017-10-16 NOTE — Discharge Instructions (Addendum)
Please follow-up with Dr. Humphrey Rolls the cardiologist.  Give him a call first thing in the morning.  Tell the office staff that you were in the emergency room with chest pain and were told to follow-up with him.  They should be able to get you an appointment in the next day or 2.  You can also see the cardiologist of your choice if you prefer.  Please return for worse pain or feeling sicker at all.  Your CT scan of the chest and lab tests today were all okay and do not show any serious causes for your chest pain.

## 2017-10-16 NOTE — ED Provider Notes (Signed)
[facility] Emergency Department Provider Note   ____________________________________________   First MD Initiated Contact with Patient 10/16/17 1925     (approximate)  I have reviewed the triage vital signs and the nursing notes.   HISTORY  Chief Complaint Chest Pain    HPI Victoria Flynn is a 72 y.o. female patient was at the dog groomer when she got chest pain left-sided sharp chest pressure tightness radiating up into the neck and down to her waist.  She has had this before usually goes away 1015 minutes but this lasted 2 hours.  She was not nauseated or sweaty with it.  Pain was severe now is better but still there.   Past Medical History:  Diagnosis Date  . Acquired underactive thyroid   . Hypertension   . Psoriasis     There are no active problems to display for this patient.   Past Surgical History:  Procedure Laterality Date  . BREAST BIOPSY Left    neg 1970's  . BREAST LUMPECTOMY Left     Prior to Admission medications   Medication Sig Start Date End Date Taking? Authorizing Provider  amLODipine (NORVASC) 5 MG tablet Take 5 mg by mouth daily. 09/22/14  Yes [provider]  aspirin EC 81 MG tablet Take 81 mg by mouth daily.   Yes [provider]  calcium-vitamin D (OSCAL WITH D) 500-200 MG-UNIT per tablet Take 1 tablet by mouth 2 (two) times daily.   Yes [provider]  carvedilol (COREG) 3.125 MG tablet Take 3.125 mg by mouth 2 (two) times daily. 09/15/14  Yes [provider]  citalopram (CELEXA) 10 MG tablet Take 10 mg by mouth every morning. 08/28/14  Yes [provider]  hydrochlorothiazide (MICROZIDE) 12.5 MG capsule Take 1 capsule by mouth daily. 10/16/17  Yes [provider]  levothyroxine (SYNTHROID, LEVOTHROID) 137 MCG tablet Take 137 mcg by mouth every morning.  09/28/14  Yes [provider]  losartan (COZAAR) 50 MG tablet Take 1 tablet by mouth daily. 09/17/17  Yes  [provider]  Melatonin (MELATONIN MAXIMUM STRENGTH) 5 MG TABS Take 5-10 mg by mouth at bedtime.   Yes [provider]  Omega-3 Fatty Acids (FISH OIL) 1200 MG CAPS Take 1,200 mg by mouth 2 (two) times daily.   Yes [provider]  traZODone (DESYREL) 50 MG tablet Take 1 tablet by mouth daily. 10/05/17  Yes [provider]  HYDROcodone-acetaminophen (NORCO/VICODIN) 5-325 MG per tablet Take 1 tablet by mouth every 4 (four) hours as needed for moderate pain. Patient not taking: Reported on 10/16/2017 10/03/14   Lavonia Drafts, MD  LORazepam (ATIVAN) 0.5 MG tablet Take 0.5 mg by mouth 3 (three) times daily as needed. For anxiety. 08/25/14   [provider]    Allergies Morphine and related  Family History  Problem Relation Age of Onset  . Breast cancer Paternal Aunt     Social History Social History   Tobacco Use  . Smoking status: Never Smoker  . Smokeless tobacco: Never Used  Substance Use Topics  . Alcohol use: No  . Drug use: No    Review of Systems  Constitutional: No fever/chills Eyes: No visual changes. ENT: No sore throat. Cardiovascular:  chest pain. Respiratory: shortness of breath. Gastrointestinal: No abdominal pain.  No nausea, no vomiting.  No diarrhea.  No constipation. Genitourinary: Negative for dysuria. Musculoskeletal: Negative for back pain. Skin: Negative for rash. Neurological: Negative for headaches, focal weakness  ____________________________________________   PHYSICAL EXAM:  VITAL SIGNS: ED Triage Vitals  Enc Vitals Group     BP      Pulse      Resp      Temp      Temp src      SpO2      Weight      Height      Head Circumference      Peak Flow      Pain Score      Pain Loc      Pain Edu?      Excl. in [place]?     Constitutional: Alert and oriented.  Pale and ill-appearing Eyes: Conjunctivae are normal.  Head: Atraumatic. Nose: No congestion/rhinnorhea. Mouth/Throat: Mucous membranes are  moist.  Oropharynx non-erythematous. Neck: No stridor.   Cardiovascular: Normal rate, regular rhythm. Grossly normal heart sounds.  Good peripheral circulation. Respiratory: Normal respiratory effort.  No retractions. Lungs CTAB. Gastrointestinal: Soft and nontender. No distention. No abdominal bruits. No CVA tenderness. Musculoskeletal: No lower extremity tenderness nor edema.   Neurologic:  Normal speech and language. No gross focal neurologic deficits are appreciated.  Skin:  Skin is warm, dry and intact. No rash noted. Psychiatric: Mood and affect are normal. Speech and behavior are normal.  ____________________________________________   LABS (all labs ordered are listed, but only abnormal results are displayed)  Labs Reviewed  COMPREHENSIVE METABOLIC PANEL - Abnormal; Notable for the following components:      Result Value   Potassium 3.4 (*)    Glucose, Bld 132 (*)    AST 64 (*)    All other components within normal limits  CBC WITH DIFFERENTIAL/PLATELET - Abnormal; Notable for the following components:   WBC 17.3 (*)    Neutro Abs 15.1 (*)    All other components within normal limits  TROPONIN I  TROPONIN I   ____________________________________________  EKG  EKG read interpreted by me shows sinus bradycardia rate of 54 normal axis no acute ST-T wave changes ____________________________________________  RADIOLOGY  ED MD interpretation: Patient's chest x-ray shows no acute disease per radiology reading in my review heart is borderline in size with the patient's white blood count of 17,000 which is new for her I will CT her chest to check for PE and/or pneumonia.  Official radiology report(s): Ct Angio Chest Pe W And/or Wo Contrast  Result Date: 10/16/2017 CLINICAL DATA:  Chest pain for 2 hours, initial encounter EXAM: CT ANGIOGRAPHY CHEST WITH CONTRAST TECHNIQUE: Multidetector CT imaging of the chest was performed using the standard protocol during bolus  administration of intravenous contrast. Multiplanar CT image reconstructions and MIPs were obtained to evaluate the vascular anatomy. CONTRAST:  2mL ISOVUE-370 IOPAMIDOL (ISOVUE-370) INJECTION 76% COMPARISON:  None. FINDINGS: Cardiovascular: Thoracic aorta demonstrates atherosclerotic change without aneurysmal dilatation or dissection. No cardiac enlargement is seen. The pulmonary artery shows a normal branching pattern without intraluminal filling defect to suggest pulmonary embolism. Mediastinum/Nodes: Thoracic inlet is within normal limits. No hilar or mediastinal adenopathy is noted. The esophagus is within normal limits. Lungs/Pleura: Lungs are well aerated bilaterally. No focal infiltrate or sizable effusion is seen. No pneumothorax is noted. A few scattered small less than 5 mm nodules are identified bilaterally. These are best seen in the right upper lobe on image number 19 of series 6, in the left upper lobe on image number 21 of series 6 and in the left lower lobe on image number 49 of series 6. no other focal abnormality  is seen. Upper Abdomen: Visualized upper abdomen is within normal limits. Musculoskeletal: Degenerative changes of the thoracic spine are noted. Review of the MIP images confirms the above findings. IMPRESSION: No evidence of pulmonary emboli. Multiple small pulmonary nodules bilaterally. No follow-up needed if patient is low-risk (and has no known or suspected primary neoplasm). Non-contrast chest CT can be considered in 12 months if patient is high-risk. This recommendation follows the consensus statement: Guidelines for Management of Incidental Pulmonary Nodules Detected on CT Images: From the Fleischner Society 2017; Radiology 2017; 284:228-243. Aortic Atherosclerosis (ICD10-I70.0). Electronically Signed   By: Inez Catalina M.D.   On: 10/16/2017 21:16   Dg Chest Portable 1 View  Result Date: 10/16/2017 CLINICAL DATA:  Chest pain EXAM: PORTABLE CHEST 1 VIEW COMPARISON:  None.  FINDINGS: Heart is borderline in size. No confluent airspace opacities or effusions. No acute bony abnormality. IMPRESSION: Borderline heart size.  No active disease. Electronically Signed   By: Rolm Baptise M.D.   On: 10/16/2017 19:41    ____________________________________________   PROCEDURES  Procedure(s) performed:   Procedures  Critical Care performed:   ____________________________________________   INITIAL IMPRESSION / ASSESSMENT AND PLAN / ED COURSE  Initial troponin was negative.  I will sign the patient out to Dr. Joni Fears to check the second troponin and CT report.  Patient says she has been having sinus pain and stuffiness and coughing some this probably explains her white blood count.         ____________________________________________   FINAL CLINICAL IMPRESSION(S) / ED DIAGNOSES  Final diagnoses:  Nonspecific chest pain     ED Discharge Orders    None       Note:  This document was prepared using Dragon voice recognition software and may include unintentional dictation errors.    Nena Polio, MD 10/16/17 2139

## 2017-10-16 NOTE — ED Provider Notes (Signed)
CT scan of the chest unremarkable, no evidence for PE.  Some small pulmonary nodules which are not significant at this time.  Second troponin negative.  Blood pressure remains hypertensive but stable.  Patient reports her pain has resolved.  Suitable for discharge home and outpatient follow-up with cardiology.   Carrie Mew, MD 10/16/17 [phone]

## 2017-10-16 NOTE — ED Notes (Addendum)
Pt to and from CT. ABCs intact. NAD. Pt and family aware of POC and deny needs/complaints. Call bell within reach.

## 2017-12-04 ENCOUNTER — Other Ambulatory Visit: Payer: Self-pay | Admitting: Internal Medicine

## 2017-12-04 DIAGNOSIS — Z1231 Encounter for screening mammogram for malignant neoplasm of breast: Secondary | ICD-10-CM

## 2018-01-01 ENCOUNTER — Ambulatory Visit
Admission: RE | Admit: 2018-01-01 | Discharge: 2018-01-01 | Disposition: A | Discharge status: Home / Self Care | Payer: Medicare Other | Source: Ambulatory Visit | Attending: Internal Medicine | Admitting: Internal Medicine

## 2018-01-01 DIAGNOSIS — Z1231 Encounter for screening mammogram for malignant neoplasm of breast: Secondary | ICD-10-CM | POA: Diagnosis present

## 2018-03-15 DIAGNOSIS — Z86010 Personal history of colonic polyps: Secondary | ICD-10-CM | POA: Insufficient documentation

## 2018-03-15 DIAGNOSIS — Z8719 Personal history of other diseases of the digestive system: Secondary | ICD-10-CM | POA: Insufficient documentation

## 2018-03-25 ENCOUNTER — Other Ambulatory Visit: Payer: Self-pay | Admitting: Internal Medicine

## 2018-03-25 DIAGNOSIS — R413 Other amnesia: Secondary | ICD-10-CM

## 2018-04-01 ENCOUNTER — Ambulatory Visit
Admission: RE | Admit: 2018-04-01 | Discharge: 2018-04-01 | Disposition: A | Discharge status: Home / Self Care | Payer: Medicare Other | Source: Ambulatory Visit | Attending: Internal Medicine | Admitting: Internal Medicine

## 2018-04-01 DIAGNOSIS — R413 Other amnesia: Secondary | ICD-10-CM | POA: Diagnosis present

## 2018-05-15 ENCOUNTER — Encounter: Admission: RE | Payer: Self-pay | Source: Home / Self Care

## 2018-05-15 ENCOUNTER — Ambulatory Visit
Admission: RE | Admit: 2018-05-15 | Payer: Medicare Other | Source: Home / Self Care | Admitting: Unknown Physician Specialty

## 2018-05-15 SURGERY — COLONOSCOPY WITH PROPOFOL
Anesthesia: General

## 2018-06-14 ENCOUNTER — Other Ambulatory Visit: Payer: Self-pay | Admitting: Neurology

## 2018-06-14 DIAGNOSIS — G3184 Mild cognitive impairment, so stated: Secondary | ICD-10-CM

## 2018-06-25 ENCOUNTER — Ambulatory Visit: Payer: Medicare Other

## 2018-07-03 ENCOUNTER — Other Ambulatory Visit: Payer: Self-pay

## 2018-07-03 ENCOUNTER — Ambulatory Visit
Admission: RE | Admit: 2018-07-03 | Discharge: 2018-07-03 | Disposition: A | Discharge status: Home / Self Care | Payer: Medicare Other | Source: Ambulatory Visit | Attending: Neurology | Admitting: Neurology

## 2018-07-03 DIAGNOSIS — G3184 Mild cognitive impairment, so stated: Secondary | ICD-10-CM | POA: Diagnosis not present

## 2018-07-12 DIAGNOSIS — M1712 Unilateral primary osteoarthritis, left knee: Secondary | ICD-10-CM | POA: Insufficient documentation

## 2018-07-17 ENCOUNTER — Encounter
Admission: RE | Admit: 2018-07-17 | Discharge: 2018-07-17 | Disposition: A | Discharge status: Home / Self Care | Payer: Medicare Other | Source: Ambulatory Visit | Attending: Unknown Physician Specialty | Admitting: Unknown Physician Specialty

## 2018-07-22 ENCOUNTER — Ambulatory Visit: Admit: 2018-07-22 | Payer: Medicare Other | Admitting: Unknown Physician Specialty

## 2018-07-22 SURGERY — COLONOSCOPY WITH PROPOFOL
Anesthesia: General

## 2018-08-07 ENCOUNTER — Other Ambulatory Visit: Payer: Self-pay

## 2018-08-07 ENCOUNTER — Other Ambulatory Visit
Admission: RE | Admit: 2018-08-07 | Discharge: 2018-08-07 | Disposition: A | Discharge status: Home / Self Care | Payer: Medicare Other | Source: Ambulatory Visit | Attending: Unknown Physician Specialty | Admitting: Unknown Physician Specialty

## 2018-08-07 DIAGNOSIS — Z1159 Encounter for screening for other viral diseases: Secondary | ICD-10-CM | POA: Insufficient documentation

## 2018-08-08 LAB — NOVEL CORONAVIRUS, NAA (HOSP ORDER, SEND-OUT TO REF LAB; TAT 18-24 HRS): SARS-CoV-2, NAA: NOT DETECTED

## 2018-08-09 ENCOUNTER — Encounter: Payer: Self-pay | Admitting: *Deleted

## 2018-08-12 ENCOUNTER — Ambulatory Visit: Payer: Medicare Other | Admitting: Anesthesiology

## 2018-08-12 ENCOUNTER — Encounter
Admission: RE | Disposition: A | Discharge status: Home / Self Care | Payer: Self-pay | Source: Home / Self Care | Attending: Unknown Physician Specialty

## 2018-08-12 ENCOUNTER — Ambulatory Visit
Admission: RE | Admit: 2018-08-12 | Discharge: 2018-08-12 | Disposition: A | Discharge status: Home / Self Care | Payer: Medicare Other | Attending: Unknown Physician Specialty | Admitting: Unknown Physician Specialty

## 2018-08-12 ENCOUNTER — Encounter: Payer: Self-pay | Admitting: Anesthesiology

## 2018-08-12 ENCOUNTER — Other Ambulatory Visit: Payer: Self-pay

## 2018-08-12 DIAGNOSIS — K640 First degree hemorrhoids: Secondary | ICD-10-CM | POA: Insufficient documentation

## 2018-08-12 DIAGNOSIS — D122 Benign neoplasm of ascending colon: Secondary | ICD-10-CM | POA: Diagnosis not present

## 2018-08-12 DIAGNOSIS — D125 Benign neoplasm of sigmoid colon: Secondary | ICD-10-CM | POA: Diagnosis not present

## 2018-08-12 DIAGNOSIS — Z86010 Personal history of colonic polyps: Secondary | ICD-10-CM | POA: Diagnosis not present

## 2018-08-12 DIAGNOSIS — Z885 Allergy status to narcotic agent status: Secondary | ICD-10-CM | POA: Insufficient documentation

## 2018-08-12 DIAGNOSIS — Z803 Family history of malignant neoplasm of breast: Secondary | ICD-10-CM | POA: Diagnosis not present

## 2018-08-12 DIAGNOSIS — Z9660 Presence of unspecified orthopedic joint implant: Secondary | ICD-10-CM | POA: Diagnosis not present

## 2018-08-12 DIAGNOSIS — Z79899 Other long term (current) drug therapy: Secondary | ICD-10-CM | POA: Diagnosis not present

## 2018-08-12 DIAGNOSIS — I10 Essential (primary) hypertension: Secondary | ICD-10-CM | POA: Diagnosis not present

## 2018-08-12 DIAGNOSIS — Z7982 Long term (current) use of aspirin: Secondary | ICD-10-CM | POA: Insufficient documentation

## 2018-08-12 DIAGNOSIS — L409 Psoriasis, unspecified: Secondary | ICD-10-CM | POA: Diagnosis not present

## 2018-08-12 DIAGNOSIS — Z1211 Encounter for screening for malignant neoplasm of colon: Secondary | ICD-10-CM | POA: Diagnosis present

## 2018-08-12 DIAGNOSIS — D124 Benign neoplasm of descending colon: Secondary | ICD-10-CM | POA: Insufficient documentation

## 2018-08-12 DIAGNOSIS — K5730 Diverticulosis of large intestine without perforation or abscess without bleeding: Secondary | ICD-10-CM | POA: Diagnosis not present

## 2018-08-12 DIAGNOSIS — E039 Hypothyroidism, unspecified: Secondary | ICD-10-CM | POA: Insufficient documentation

## 2018-08-12 DIAGNOSIS — G4730 Sleep apnea, unspecified: Secondary | ICD-10-CM | POA: Insufficient documentation

## 2018-08-12 HISTORY — PX: COLONOSCOPY WITH PROPOFOL: SHX5780

## 2018-08-12 HISTORY — DX: Sleep apnea, unspecified: G47.30

## 2018-08-12 SURGERY — COLONOSCOPY WITH PROPOFOL
Anesthesia: General

## 2018-08-12 MED ORDER — PROPOFOL 500 MG/50ML IV EMUL
INTRAVENOUS | Status: DC | PRN
  Administered 2018-08-12: 130 ug/kg/min via INTRAVENOUS

## 2018-08-12 MED ORDER — PIPERACILLIN-TAZOBACTAM 3.375 G IVPB 30 MIN
3.3750 g | Freq: Once | INTRAVENOUS | Status: AC
  Administered 2018-08-12: 3.375 g via INTRAVENOUS
  Filled 2018-08-12: qty 50

## 2018-08-12 MED ORDER — LIDOCAINE HCL (PF) 2 % IJ SOLN
INTRAMUSCULAR | Status: AC
  Filled 2018-08-12: qty 10

## 2018-08-12 MED ORDER — PIPERACILLIN-TAZOBACTAM 3.375 G IVPB
INTRAVENOUS | Status: AC
  Filled 2018-08-12: qty 50

## 2018-08-12 MED ORDER — LIDOCAINE HCL (CARDIAC) PF 100 MG/5ML IV SOSY
PREFILLED_SYRINGE | INTRAVENOUS | Status: DC | PRN
  Administered 2018-08-12: 50 mg via INTRAVENOUS

## 2018-08-12 MED ORDER — SODIUM CHLORIDE 0.9 % IV SOLN
INTRAVENOUS | Status: DC
  Administered 2018-08-12: 11:00:00 via INTRAVENOUS

## 2018-08-12 MED ORDER — GLYCOPYRROLATE 0.2 MG/ML IJ SOLN
INTRAMUSCULAR | Status: AC
  Filled 2018-08-12: qty 1

## 2018-08-12 MED ORDER — PROPOFOL 10 MG/ML IV BOLUS
INTRAVENOUS | Status: DC | PRN
  Administered 2018-08-12: 80 mg via INTRAVENOUS

## 2018-08-12 MED ORDER — GLYCOPYRROLATE 0.2 MG/ML IJ SOLN
INTRAMUSCULAR | Status: DC | PRN
  Administered 2018-08-12: 0.2 mg via INTRAVENOUS

## 2018-08-12 MED ORDER — PROPOFOL 500 MG/50ML IV EMUL
INTRAVENOUS | Status: AC
  Filled 2018-08-12: qty 50

## 2018-08-12 MED ORDER — SODIUM CHLORIDE 0.9 % IV SOLN: INTRAVENOUS | Status: DC

## 2018-08-12 NOTE — Anesthesia Preprocedure Evaluation (Signed)
Anesthesia Evaluation  Patient identified by MRN, date of birth, ID band Patient awake    Reviewed: Allergy & Precautions, NPO status , Patient's Chart, lab work & pertinent test results, reviewed documented beta blocker date and time   Airway Mallampati: II  TM Distance: >3 FB     Dental  (+) Chipped   Pulmonary sleep apnea ,           Cardiovascular hypertension, Pt. on medications      Neuro/Psych    GI/Hepatic   Endo/Other  Hypothyroidism   Renal/GU      Musculoskeletal   Abdominal   Peds  Hematology   Anesthesia Other Findings   Reproductive/Obstetrics                             Anesthesia Physical Anesthesia Plan  ASA: II  Anesthesia Plan: General   Post-op Pain Management:    Induction: Intravenous  PONV Risk Score and Plan:   Airway Management Planned:   Additional Equipment:   Intra-op Plan:   Post-operative Plan:   Informed Consent: I have reviewed the patients History and Physical, chart, labs and discussed the procedure including the risks, benefits and alternatives for the proposed anesthesia with the patient or authorized representative who has indicated his/her understanding and acceptance.       Plan Discussed with: CRNA  Anesthesia Plan Comments:         Anesthesia Quick Evaluation

## 2018-08-12 NOTE — Transfer of Care (Signed)
Immediate Anesthesia Transfer of Care Note  Patient: Victoria Flynn  Procedure(s) Performed: COLONOSCOPY WITH PROPOFOL (N/A )  Patient Location: PACU and Endoscopy Unit  Anesthesia Type:General  Level of Consciousness: awake, alert , oriented and patient cooperative  Airway & Oxygen Therapy: Patient Spontanous Breathing  Post-op Assessment: Report given to RN and Post -op Vital signs reviewed and stable  Post vital signs: Reviewed and stable  Last Vitals:  Vitals Value Taken Time  BP 128/78 08/12/18 1125  Temp    Pulse 87 08/12/18 1126  Resp 11 08/12/18 1126  SpO2 98 % 08/12/18 1126  Vitals shown include unvalidated device data.  Last Pain:  Vitals:   08/12/18 1024  TempSrc: Tympanic         Complications: No apparent anesthesia complications

## 2018-08-12 NOTE — Anesthesia Post-op Follow-up Note (Signed)
Anesthesia QCDR form completed.        

## 2018-08-12 NOTE — Op Note (Signed)
[facility] Gastroenterology Patient Name: Victoria Flynn Procedure Date: 08/12/2018 10:48 AM MRN: [id number] Account #: [id number] Date of Birth: [birth year] Admit Type: Outpatient Age: 73 Room: [facility] ENDO ROOM 1 Gender: Female Note Status: Finalized Procedure:            Colonoscopy Indications:          High risk colon cancer surveillance: Personal history                        of colonic polyps Providers:            Manya Silvas, MD Medicines:            Propofol per Anesthesia Complications:        No immediate complications. Procedure:            Pre-Anesthesia Assessment:                       - After reviewing the risks and benefits, the patient                        was deemed in satisfactory condition to undergo the                        procedure.                       After obtaining informed consent, the colonoscope was                        passed under direct vision. Throughout the procedure,                        the patient's blood pressure, pulse, and oxygen                        saturations were monitored continuously. The                        Colonoscope was introduced through the anus and                        advanced to the the cecum, identified by appendiceal                        orifice and ileocecal valve. The colonoscopy was                        performed without difficulty. The patient tolerated the                        procedure well. The quality of the bowel preparation                        was good. Findings:      A 10 mm polyp was found in the proximal descending colon. The polyp was       sessile. The polyp was removed with a hot snare. Resection and retrieval       were complete.      A diminutive polyp was found in the ascending colon. The polyp was       sessile.  The polyp was removed with a cold snare. Resection and       retrieval were complete.      A few small-mouthed diverticula were found in the sigmoid  colon.      Internal hemorrhoids were found during endoscopy. The hemorrhoids were       small and Grade I (internal hemorrhoids that do not prolapse).      The exam was otherwise without abnormality. Impression:           - One 10 mm polyp in the proximal descending colon,                        removed with a hot snare. Resected and retrieved.                       - One diminutive polyp in the ascending colon, removed                        with a cold snare. Resected and retrieved.                       - Diverticulosis in the sigmoid colon.                       - Internal hemorrhoids.                       - The examination was otherwise normal. Recommendation:       - Await pathology results. Manya Silvas, MD 08/12/2018 11:26:41 AM This report has been signed electronically. Number of Addenda: 0 Note Initiated On: 08/12/2018 10:48 AM Scope Withdrawal Time: 0 hours 5 minutes 52 seconds  Total Procedure Duration: 0 hours 13 minutes 18 seconds  Estimated Blood Loss: Estimated blood loss: none.      [facility]

## 2018-08-12 NOTE — Anesthesia Postprocedure Evaluation (Signed)
Anesthesia Post Note  Patient: Victoria Flynn  Procedure(s) Performed: COLONOSCOPY WITH PROPOFOL (N/A )  Patient location during evaluation: Endoscopy Anesthesia Type: General Level of consciousness: awake and alert Pain management: pain level controlled Vital Signs Assessment: post-procedure vital signs reviewed and stable Respiratory status: spontaneous breathing, nonlabored ventilation, respiratory function stable and patient connected to nasal cannula oxygen Cardiovascular status: blood pressure returned to baseline and stable Postop Assessment: no apparent nausea or vomiting Anesthetic complications: no     Last Vitals:  Vitals:   08/12/18 1024 08/12/18 1126  BP: (!) 195/95 128/78  Pulse: 66 87  Resp: 18 18  Temp: (!) 36.2 C   SpO2: 97% 97%    Last Pain:  Vitals:   08/12/18 1146  TempSrc:   PainSc: 0-No pain                 Ayodele Hartsock S

## 2018-08-12 NOTE — H&P (Signed)
Primary Care Physician:  Adin Hector, MD Primary Gastroenterologist:  Dr. Vira Agar  Pre-Procedure History & Physical: HPI:  Victoria Flynn is a 73 y.o. female is here for an colonoscopy.   Past Medical History:  Diagnosis Date  . Acquired underactive thyroid   . Hypertension   . Psoriasis   . Sleep apnea     Past Surgical History:  Procedure Laterality Date  . BREAST EXCISIONAL BIOPSY Left    neg 1970's  . BREAST SURGERY    . JOINT REPLACEMENT      Prior to Admission medications   Medication Sig Start Date End Date Taking? Authorizing Provider  amLODipine (NORVASC) 5 MG tablet Take 5 mg by mouth daily. 09/22/14  Yes [provider]  calcium-vitamin D (OSCAL WITH D) 500-200 MG-UNIT per tablet Take 1 tablet by mouth 2 (two) times daily.   Yes [provider]  carvedilol (COREG) 3.125 MG tablet Take 3.125 mg by mouth 2 (two) times daily. 09/15/14  Yes [provider]  citalopram (CELEXA) 10 MG tablet Take 10 mg by mouth every morning. 08/28/14  Yes [provider]  levothyroxine (SYNTHROID, LEVOTHROID) 137 MCG tablet Take 137 mcg by mouth every morning.  09/28/14  Yes [provider]  LORazepam (ATIVAN) 0.5 MG tablet Take 0.5 mg by mouth 3 (three) times daily as needed. For anxiety. 08/25/14  Yes [provider]  losartan (COZAAR) 50 MG tablet Take 1 tablet by mouth daily. 09/17/17  Yes [provider]  Melatonin (MELATONIN MAXIMUM STRENGTH) 5 MG TABS Take 5-10 mg by mouth at bedtime.   Yes [provider]  Omega-3 Fatty Acids (FISH OIL) 1200 MG CAPS Take 1,200 mg by mouth 2 (two) times daily.   Yes [provider]  aspirin EC 81 MG tablet Take 81 mg by mouth daily.    [provider]  hydrochlorothiazide (MICROZIDE) 12.5 MG capsule Take 1 capsule by mouth daily. 10/16/17   [provider]  HYDROcodone-acetaminophen (NORCO/VICODIN) 5-325 MG per tablet Take 1 tablet by mouth every 4 (four)  hours as needed for moderate pain. Patient not taking: Reported on 10/16/2017 10/03/14   Lavonia Drafts, MD  traZODone (DESYREL) 50 MG tablet Take 1 tablet by mouth daily. 10/05/17   [provider]    Allergies as of 07/23/2018 - Review Complete 10/16/2017  Allergen Reaction Noted  . Morphine and related Nausea And Vomiting 10/03/2014    Family History  Problem Relation Age of Onset  . Breast cancer Paternal Aunt     Social History   Socioeconomic History  . Marital status: Married    Spouse name: Not on file  . Number of children: Not on file  . Years of education: Not on file  . Highest education level: Not on file  Occupational History  . Not on file  Social Needs  . Financial resource strain: Not on file  . Food insecurity    Worry: Not on file    Inability: Not on file  . Transportation needs    Medical: Not on file    Non-medical: Not on file  Tobacco Use  . Smoking status: Never Smoker  . Smokeless tobacco: Never Used  Substance and Sexual Activity  . Alcohol use: No  . Drug use: No  . Sexual activity: Not on file  Lifestyle  . Physical activity    Days per week: Not on file    Minutes per session: Not on file  . Stress:  Not on file  Relationships  . Social Herbalist on phone: Not on file    Gets together: Not on file    Attends religious service: Not on file    Active member of club or organization: Not on file    Attends meetings of clubs or organizations: Not on file    Relationship status: Not on file  . Intimate partner violence    Fear of current or ex partner: Not on file    Emotionally abused: Not on file    Physically abused: Not on file    Forced sexual activity: Not on file  Other Topics Concern  . Not on file  Social History Narrative  . Not on file    Review of Systems: See HPI, otherwise negative ROS  Physical Exam: BP (!) 195/95   Pulse 66   Temp (!) 97.2 F (36.2 C) (Tympanic)   Resp 18   Ht 5\' 1"  (1.549  m)   Wt 88.5 kg   SpO2 97%   BMI 36.84 kg/m  General:   Alert,  pleasant and cooperative in NAD Head:  Normocephalic and atraumatic. Neck:  Supple; no masses or thyromegaly. Lungs:  Clear throughout to auscultation.    Heart:  Regular rate and rhythm. Abdomen:  Soft, nontender and nondistended. Normal bowel sounds, without guarding, and without rebound.   Neurologic:  Alert and  oriented x4;  grossly normal neurologically.  Impression/Plan: Victoria Flynn is here for an colonoscopy to be performed for [facility] colon polyps.  Risks, benefits, limitations, and alternatives regarding  colonoscopy have been reviewed with the patient.  Questions have been answered.  All parties agreeable.   Gaylyn Cheers, MD  08/12/2018, 10:54 AM

## 2018-08-13 ENCOUNTER — Encounter: Payer: Self-pay | Admitting: Unknown Physician Specialty

## 2018-08-13 LAB — SURGICAL PATHOLOGY

## 2018-08-26 ENCOUNTER — Encounter: Payer: Self-pay | Admitting: Unknown Physician Specialty

## 2018-09-09 NOTE — Discharge Instructions (Signed)
°  Instructions after Total Knee Replacement ° ° Victoria Flynn, Jr., M.D.    ° Dept. of Orthopaedics & Sports Medicine ° [facility] ° [address] ° [place], [place]  [address] ° Phone: [phone]   Fax: [phone] ° °  °DIET: °• Drink plenty of non-alcoholic fluids. °• Resume your normal diet. Include foods high in fiber. ° °ACTIVITY:  °• You may use crutches or a walker with weight-bearing as tolerated, unless instructed otherwise. °• You may be weaned off of the walker or crutches by your Physical Therapist.  °• Do NOT place pillows under the knee. Anything placed under the knee could limit your ability to straighten the knee.   °• Continue doing gentle exercises. Exercising will reduce the pain and swelling, increase motion, and prevent muscle weakness.   °• Please continue to use the TED compression stockings for 6 weeks. You may remove the stockings at night, but should reapply them in the morning. °• Do not drive or operate any equipment until instructed. ° °WOUND CARE:  °• Continue to use the PolarCare or ice packs periodically to reduce pain and swelling. °• You may bathe or shower after the staples are removed at the first office visit following surgery. ° °MEDICATIONS: °• You may resume your regular medications. °• Please take the pain medication as prescribed on the medication. °• Do not take pain medication on an empty stomach. °• You have been given a prescription for a blood thinner (Lovenox or Coumadin). Please take the medication as instructed. (NOTE: After completing a 2 week course of Lovenox, take one Enteric-coated aspirin once a day. This along with elevation will help reduce the possibility of phlebitis in your operated leg.) °• Do not drive or drink alcoholic beverages when taking pain medications. ° °CALL THE OFFICE FOR: °• Temperature above 101 degrees °• Excessive bleeding or drainage on the dressing. °• Excessive swelling, coldness, or paleness of the toes. °• Persistent  nausea and vomiting. ° °FOLLOW-UP:  °• You should have an appointment to return to the office in 10-14 days after surgery. °• Arrangements have been made for continuation of Physical Therapy (either home therapy or outpatient therapy). °  °

## 2018-09-13 ENCOUNTER — Encounter
Admission: RE | Admit: 2018-09-13 | Discharge: 2018-09-13 | Disposition: A | Discharge status: Home / Self Care | Payer: Medicare Other | Source: Ambulatory Visit | Attending: Orthopedic Surgery | Admitting: Orthopedic Surgery

## 2018-09-13 ENCOUNTER — Other Ambulatory Visit: Payer: Self-pay

## 2018-09-13 DIAGNOSIS — R001 Bradycardia, unspecified: Secondary | ICD-10-CM | POA: Insufficient documentation

## 2018-09-13 DIAGNOSIS — Z01818 Encounter for other preprocedural examination: Secondary | ICD-10-CM | POA: Diagnosis not present

## 2018-09-13 HISTORY — DX: Anxiety disorder, unspecified: F41.9

## 2018-09-13 HISTORY — DX: Unspecified osteoarthritis, unspecified site: M19.90

## 2018-09-13 HISTORY — DX: Other specified postprocedural states: Z98.890

## 2018-09-13 HISTORY — DX: Malignant (primary) neoplasm, unspecified: C80.1

## 2018-09-13 HISTORY — DX: Carrier or suspected carrier of methicillin resistant Staphylococcus aureus: Z22.322

## 2018-09-13 HISTORY — DX: Chronic obstructive pulmonary disease, unspecified: J44.9

## 2018-09-13 HISTORY — DX: Depression, unspecified: F32.A

## 2018-09-13 HISTORY — DX: Other complications of anesthesia, initial encounter: T88.59XA

## 2018-09-13 HISTORY — DX: Nausea with vomiting, unspecified: R11.2

## 2018-09-13 LAB — CBC
HCT: 38.1 % (ref 36.0–46.0)
Hemoglobin: 12.5 g/dL (ref 12.0–15.0)
MCH: 30.5 pg (ref 26.0–34.0)
MCHC: 32.8 g/dL (ref 30.0–36.0)
MCV: 92.9 fL (ref 80.0–100.0)
Platelets: 283 10*3/uL (ref 150–400)
RBC: 4.1 MIL/uL (ref 3.87–5.11)
RDW: 14.1 % (ref 11.5–15.5)
WBC: 10.3 10*3/uL (ref 4.0–10.5)
nRBC: 0 % (ref 0.0–0.2)

## 2018-09-13 LAB — URINALYSIS, ROUTINE W REFLEX MICROSCOPIC
Bilirubin Urine: NEGATIVE
Glucose, UA: NEGATIVE mg/dL
Hgb urine dipstick: NEGATIVE
Ketones, ur: NEGATIVE mg/dL
Leukocytes,Ua: NEGATIVE
Nitrite: NEGATIVE
Protein, ur: NEGATIVE mg/dL
Specific Gravity, Urine: 1.018 (ref 1.005–1.030)
pH: 5 (ref 5.0–8.0)

## 2018-09-13 LAB — COMPREHENSIVE METABOLIC PANEL
ALT: 15 U/L (ref 0–44)
AST: 17 U/L (ref 15–41)
Albumin: 4.5 g/dL (ref 3.5–5.0)
Alkaline Phosphatase: 52 U/L (ref 38–126)
Anion gap: 10 (ref 5–15)
BUN: 21 mg/dL (ref 8–23)
CO2: 26 mmol/L (ref 22–32)
Calcium: 9.7 mg/dL (ref 8.9–10.3)
Chloride: 103 mmol/L (ref 98–111)
Creatinine, Ser: 0.65 mg/dL (ref 0.44–1.00)
GFR calc Af Amer: >60 mL/min (ref >60)
GFR calc non Af Amer: >60 mL/min (ref >60)
Glucose, Bld: 85 mg/dL (ref 70–99)
Potassium: 3.2 mmol/L — ABNORMAL LOW (ref 3.5–5.1)
Sodium: 139 mmol/L (ref 135–145)
Total Bilirubin: 0.6 mg/dL (ref 0.3–1.2)
Total Protein: 7.8 g/dL (ref 6.5–8.1)

## 2018-09-13 LAB — TYPE AND SCREEN
ABO/RH(D): O NEG
Antibody Screen: NEGATIVE

## 2018-09-13 LAB — APTT: aPTT: 36 seconds (ref 24–36)

## 2018-09-13 LAB — PROTIME-INR
INR: 1 (ref 0.8–1.2)
Prothrombin Time: 13.4 seconds (ref 11.4–15.2)

## 2018-09-13 LAB — SEDIMENTATION RATE: Sed Rate: 34 mm/hr — ABNORMAL HIGH (ref 0–30)

## 2018-09-13 LAB — SURGICAL PCR SCREEN
MRSA, PCR: NEGATIVE
Staphylococcus aureus: NEGATIVE

## 2018-09-13 NOTE — Pre-Procedure Instructions (Addendum)
Potassium of 3.2 and elevated sed rate sent to Dr. Marry Guan for review.  Labs also sent to Anesthesia.

## 2018-09-13 NOTE — Patient Instructions (Signed)
Your procedure is scheduled on: Friday 7/31 Report to Day Surgery. To find out your arrival time please call [phone] between 1PM - 3PM on Thurs 7/30.  Remember: Instructions that are not followed completely may result in serious medical risk,  up to and including death, or upon the discretion of your surgeon and anesthesiologist your  surgery may need to be rescheduled.     _X__ 1. Do not eat food after midnight the night before your procedure.                 No gum chewing or hard candies. You may drink clear liquids up to 2 hours                 before you are scheduled to arrive for your surgery- DO not drink clear                 liquids within 2 hours of the start of your surgery.                 Clear Liquids include:  water, apple juice without pulp, clear carbohydrate                 drink such as Clearfast of Gatorade, Black Coffee or Tea (Do not add                 anything to coffee or tea).  __X__2.  On the morning of surgery brush your teeth with toothpaste and water, you                may rinse your mouth with mouthwash if you wish.  Do not swallow any toothpaste of mouthwash.     ___ 3.  No Alcohol for 24 hours before or after surgery.   ___ 4.  Do Not Smoke or use e-cigarettes For 24 Hours Prior to Your Surgery.                 Do not use any chewable tobacco products for at least 6 hours prior to                 surgery.  ____  5.  Bring all medications with you on the day of surgery if instructed.   __x__  6.  Notify your doctor if there is any change in your medical condition      (cold, fever, infections).     Do not wear jewelry, make-up, hairpins, clips or nail polish. Do not wear lotions, powders, or perfumes. You may wear deodorant. Do not shave 48 hours prior to surgery. Men may shave face and neck. Do not bring valuables to the hospital.    [facility] is not responsible for any belongings or valuables.  Contacts, dentures  or bridgework may not be worn into surgery. Leave your suitcase in the car. After surgery it may be brought to your room. For patients admitted to the hospital, discharge time is determined by your treatment team.   Patients discharged the day of surgery will not be allowed to drive home.   Please read over the following fact sheets that you were given:     __x__ Take these medicines the morning of surgery with A SIP OF WATER:    1. amLODipine (NORVASC) 5 MG tablet  2. carvedilol (COREG) 3.125 MG tablet  3. citalopram (CELEXA) 10 MG tablet  4.donepezil (ARICEPT) 5 MG tablet  5.levothyroxine (SYNTHROID, LEVOTHROID) 137 MCG tablet  6.  ____ Fleet Enema (as directed)   __x__ Use CHG Soap as directed  ____ Use inhalers on the day of surgery  ____ Stop metformin 2 days prior to surgery    ____ Take 1/2 of usual insulin dose the night before surgery. No insulin the morning          of surgery.   __x__ Stopped aspirin yesterday  __x__ Stopped  Anti-inflammatories yesterday    ___x_ Stop supplements until after surgery.  Omega-3 Fatty Acids (FISH OIL PO)  ____ Bring C-Pap to the hospital.

## 2018-09-17 ENCOUNTER — Other Ambulatory Visit
Admission: RE | Admit: 2018-09-17 | Discharge: 2018-09-17 | Disposition: A | Discharge status: Home / Self Care | Payer: Medicare Other | Source: Ambulatory Visit | Attending: Orthopedic Surgery | Admitting: Orthopedic Surgery

## 2018-09-17 ENCOUNTER — Other Ambulatory Visit: Payer: Self-pay

## 2018-09-17 DIAGNOSIS — Z20828 Contact with and (suspected) exposure to other viral communicable diseases: Secondary | ICD-10-CM | POA: Insufficient documentation

## 2018-09-17 DIAGNOSIS — Z01812 Encounter for preprocedural laboratory examination: Secondary | ICD-10-CM | POA: Diagnosis present

## 2018-09-17 LAB — C-REACTIVE PROTEIN: CRP: <0.8 mg/dL (ref <1.0)

## 2018-09-17 LAB — SARS CORONAVIRUS 2 (TAT 6-24 HRS): SARS Coronavirus 2: NEGATIVE

## 2018-09-18 LAB — URINE CULTURE: Special Requests: NORMAL

## 2018-09-18 NOTE — Pre-Procedure Instructions (Signed)
FAXED PENDING UC TO DR Marry Guan AS FYI

## 2018-09-19 MED ORDER — CEFAZOLIN SODIUM-DEXTROSE 2-4 GM/100ML-% IV SOLN
2.0000 g | INTRAVENOUS | Status: AC
  Administered 2018-09-20: 2 g via INTRAVENOUS

## 2018-09-19 MED ORDER — TRANEXAMIC ACID-NACL 1000-0.7 MG/100ML-% IV SOLN
1000.0000 mg | INTRAVENOUS | Status: AC
  Administered 2018-09-20: 1000 mg via INTRAVENOUS
  Filled 2018-09-19: qty 100

## 2018-09-20 ENCOUNTER — Inpatient Hospital Stay: Payer: Medicare Other | Admitting: Anesthesiology

## 2018-09-20 ENCOUNTER — Encounter: Payer: Self-pay | Admitting: Orthopedic Surgery

## 2018-09-20 ENCOUNTER — Other Ambulatory Visit: Payer: Self-pay | Admitting: Orthopedic Surgery

## 2018-09-20 ENCOUNTER — Inpatient Hospital Stay
Admission: RE | Admit: 2018-09-20 | Discharge: 2018-09-22 | DRG: 470 | Disposition: A | Discharge status: Home / Self Care | Payer: Medicare Other | Attending: Orthopedic Surgery | Admitting: Orthopedic Surgery

## 2018-09-20 ENCOUNTER — Encounter
Admission: RE | Disposition: A | Discharge status: Home / Self Care | Payer: Self-pay | Source: Home / Self Care | Attending: Orthopedic Surgery

## 2018-09-20 ENCOUNTER — Inpatient Hospital Stay: Payer: Medicare Other

## 2018-09-20 ENCOUNTER — Other Ambulatory Visit: Payer: Self-pay

## 2018-09-20 DIAGNOSIS — Z79890 Hormone replacement therapy (postmenopausal): Secondary | ICD-10-CM | POA: Diagnosis not present

## 2018-09-20 DIAGNOSIS — M1712 Unilateral primary osteoarthritis, left knee: Secondary | ICD-10-CM | POA: Diagnosis present

## 2018-09-20 DIAGNOSIS — F329 Major depressive disorder, single episode, unspecified: Secondary | ICD-10-CM | POA: Diagnosis present

## 2018-09-20 DIAGNOSIS — Z79899 Other long term (current) drug therapy: Secondary | ICD-10-CM

## 2018-09-20 DIAGNOSIS — I10 Essential (primary) hypertension: Secondary | ICD-10-CM | POA: Diagnosis present

## 2018-09-20 DIAGNOSIS — Z22322 Carrier or suspected carrier of Methicillin resistant Staphylococcus aureus: Secondary | ICD-10-CM

## 2018-09-20 DIAGNOSIS — J449 Chronic obstructive pulmonary disease, unspecified: Secondary | ICD-10-CM | POA: Diagnosis present

## 2018-09-20 DIAGNOSIS — Z7982 Long term (current) use of aspirin: Secondary | ICD-10-CM | POA: Diagnosis not present

## 2018-09-20 DIAGNOSIS — F419 Anxiety disorder, unspecified: Secondary | ICD-10-CM | POA: Diagnosis present

## 2018-09-20 DIAGNOSIS — M25762 Osteophyte, left knee: Secondary | ICD-10-CM | POA: Diagnosis present

## 2018-09-20 DIAGNOSIS — Z791 Long term (current) use of non-steroidal anti-inflammatories (NSAID): Secondary | ICD-10-CM

## 2018-09-20 DIAGNOSIS — Z96659 Presence of unspecified artificial knee joint: Secondary | ICD-10-CM

## 2018-09-20 HISTORY — PX: KNEE ARTHROPLASTY: SHX992

## 2018-09-20 HISTORY — DX: Headache, unspecified: R51.9

## 2018-09-20 HISTORY — DX: Cardiac arrhythmia, unspecified: I49.9

## 2018-09-20 LAB — ABO/RH: ABO/RH(D): O NEG

## 2018-09-20 SURGERY — ARTHROPLASTY, KNEE, TOTAL, USING IMAGELESS COMPUTER-ASSISTED NAVIGATION
Anesthesia: Spinal | Site: Knee | Laterality: Left

## 2018-09-20 MED ORDER — ONDANSETRON HCL 4 MG/2ML IJ SOLN
INTRAMUSCULAR | Status: DC | PRN
  Administered 2018-09-20: 4 mg via INTRAVENOUS

## 2018-09-20 MED ORDER — ACETAMINOPHEN 10 MG/ML IV SOLN
INTRAVENOUS | Status: AC
  Filled 2018-09-20: qty 100

## 2018-09-20 MED ORDER — GLYCOPYRROLATE 0.2 MG/ML IJ SOLN
INTRAMUSCULAR | Status: DC | PRN
  Administered 2018-09-20: 0.2 mg via INTRAVENOUS

## 2018-09-20 MED ORDER — GLYCOPYRROLATE 0.2 MG/ML IJ SOLN
INTRAMUSCULAR | Status: AC
  Filled 2018-09-20: qty 1

## 2018-09-20 MED ORDER — FENTANYL CITRATE (PF) 100 MCG/2ML IJ SOLN: 25.0000 ug | INTRAMUSCULAR | Status: DC | PRN

## 2018-09-20 MED ORDER — HYDROMORPHONE HCL 1 MG/ML IJ SOLN: 0.5000 mg | INTRAMUSCULAR | Status: DC | PRN

## 2018-09-20 MED ORDER — DEXAMETHASONE SODIUM PHOSPHATE 10 MG/ML IJ SOLN
8.0000 mg | Freq: Once | INTRAMUSCULAR | Status: AC
  Administered 2018-09-20: 8 mg via INTRAVENOUS

## 2018-09-20 MED ORDER — LORAZEPAM 1 MG PO TABS
1.0000 mg | ORAL_TABLET | Freq: Every day | ORAL | Status: DC
  Administered 2018-09-20 – 2018-09-21 (×2): 1 mg via ORAL
  Filled 2018-09-20 (×3): qty 1

## 2018-09-20 MED ORDER — BISACODYL 10 MG RE SUPP: 10.0000 mg | Freq: Every day | RECTAL | Status: DC | PRN

## 2018-09-20 MED ORDER — CELECOXIB 200 MG PO CAPS
ORAL_CAPSULE | ORAL | Status: AC
  Administered 2018-09-20: 07:00:00 400 mg via ORAL
  Filled 2018-09-20: qty 2

## 2018-09-20 MED ORDER — DONEPEZIL HCL 5 MG PO TABS
5.0000 mg | ORAL_TABLET | Freq: Every day | ORAL | Status: DC
  Administered 2018-09-21 – 2018-09-22 (×2): 5 mg via ORAL
  Filled 2018-09-20 (×2): qty 1

## 2018-09-20 MED ORDER — LEVOTHYROXINE SODIUM 137 MCG PO TABS
137.0000 ug | ORAL_TABLET | ORAL | Status: DC
  Administered 2018-09-21 – 2018-09-22 (×2): 137 ug via ORAL
  Filled 2018-09-20 (×2): qty 1

## 2018-09-20 MED ORDER — METOCLOPRAMIDE HCL 10 MG PO TABS
5.0000 mg | ORAL_TABLET | Freq: Three times a day (TID) | ORAL | Status: DC | PRN
  Administered 2018-09-20: 10 mg via ORAL

## 2018-09-20 MED ORDER — CALCIUM CARBONATE-VITAMIN D 500-200 MG-UNIT PO TABS
1.0000 | ORAL_TABLET | Freq: Two times a day (BID) | ORAL | Status: DC
  Administered 2018-09-20 – 2018-09-22 (×4): 1 via ORAL
  Filled 2018-09-20 (×4): qty 1

## 2018-09-20 MED ORDER — FENTANYL CITRATE (PF) 100 MCG/2ML IJ SOLN
INTRAMUSCULAR | Status: DC | PRN
  Administered 2018-09-20 (×2): 50 ug via INTRAVENOUS

## 2018-09-20 MED ORDER — ONDANSETRON HCL 4 MG PO TABS: 4.0000 mg | ORAL_TABLET | Freq: Four times a day (QID) | ORAL | Status: DC | PRN

## 2018-09-20 MED ORDER — APREMILAST 30 MG PO TABS: 30.0000 mg | ORAL_TABLET | Freq: Every day | ORAL | Status: DC

## 2018-09-20 MED ORDER — TETRACAINE HCL 1 % IJ SOLN
INTRAMUSCULAR | Status: DC | PRN
  Administered 2018-09-20: 5 mg via INTRASPINAL

## 2018-09-20 MED ORDER — FAMOTIDINE 20 MG PO TABS
ORAL_TABLET | ORAL | Status: AC
  Administered 2018-09-20: 20 mg via ORAL
  Filled 2018-09-20: qty 1

## 2018-09-20 MED ORDER — METOCLOPRAMIDE HCL 5 MG/ML IJ SOLN: 5.0000 mg | Freq: Three times a day (TID) | INTRAMUSCULAR | Status: DC | PRN

## 2018-09-20 MED ORDER — DEXAMETHASONE SODIUM PHOSPHATE 10 MG/ML IJ SOLN
INTRAMUSCULAR | Status: DC | PRN
  Administered 2018-09-20: 10 mg via INTRAVENOUS

## 2018-09-20 MED ORDER — SODIUM CHLORIDE 0.9 % IV SOLN
INTRAVENOUS | Status: DC
  Administered 2018-09-20 – 2018-09-21 (×2): via INTRAVENOUS

## 2018-09-20 MED ORDER — OXYCODONE HCL 5 MG PO TABS: 5.0000 mg | ORAL_TABLET | Freq: Once | ORAL | Status: DC | PRN

## 2018-09-20 MED ORDER — PROPOFOL 500 MG/50ML IV EMUL
INTRAVENOUS | Status: DC | PRN
  Administered 2018-09-20: 75 ug/kg/min via INTRAVENOUS

## 2018-09-20 MED ORDER — FAMOTIDINE 20 MG PO TABS
20.0000 mg | ORAL_TABLET | Freq: Once | ORAL | Status: AC
  Administered 2018-09-20: 20 mg via ORAL

## 2018-09-20 MED ORDER — OXYCODONE HCL 5 MG PO TABS
5.0000 mg | ORAL_TABLET | ORAL | Status: DC | PRN
  Administered 2018-09-20 – 2018-09-22 (×4): 5 mg via ORAL
  Filled 2018-09-20 (×4): qty 1

## 2018-09-20 MED ORDER — SENNOSIDES-DOCUSATE SODIUM 8.6-50 MG PO TABS
1.0000 | ORAL_TABLET | Freq: Two times a day (BID) | ORAL | Status: DC
  Administered 2018-09-20 – 2018-09-21 (×3): 1 via ORAL
  Filled 2018-09-20 (×4): qty 1

## 2018-09-20 MED ORDER — METOCLOPRAMIDE HCL 10 MG PO TABS
10.0000 mg | ORAL_TABLET | Freq: Three times a day (TID) | ORAL | Status: AC
  Administered 2018-09-20 – 2018-09-22 (×7): 10 mg via ORAL
  Filled 2018-09-20 (×8): qty 1

## 2018-09-20 MED ORDER — FERROUS SULFATE 325 (65 FE) MG PO TABS
325.0000 mg | ORAL_TABLET | Freq: Two times a day (BID) | ORAL | Status: DC
  Administered 2018-09-20 – 2018-09-22 (×4): 325 mg via ORAL
  Filled 2018-09-20 (×4): qty 1

## 2018-09-20 MED ORDER — OXYCODONE HCL 5 MG/5ML PO SOLN: 5.0000 mg | Freq: Once | ORAL | Status: DC | PRN

## 2018-09-20 MED ORDER — ENSURE ENLIVE PO LIQD: 296.0000 mL | Freq: Once | ORAL | Status: DC

## 2018-09-20 MED ORDER — PROPOFOL 500 MG/50ML IV EMUL
INTRAVENOUS | Status: AC
  Filled 2018-09-20: qty 100

## 2018-09-20 MED ORDER — FENTANYL CITRATE (PF) 100 MCG/2ML IJ SOLN
INTRAMUSCULAR | Status: AC
  Filled 2018-09-20: qty 2

## 2018-09-20 MED ORDER — NEOMYCIN-POLYMYXIN B GU 40-200000 IR SOLN
Status: AC
  Filled 2018-09-20: qty 20

## 2018-09-20 MED ORDER — ONDANSETRON HCL 4 MG/2ML IJ SOLN
INTRAMUSCULAR | Status: AC
  Filled 2018-09-20: qty 2

## 2018-09-20 MED ORDER — BUPIVACAINE HCL (PF) 0.25 % IJ SOLN
INTRAMUSCULAR | Status: AC
  Filled 2018-09-20: qty 60

## 2018-09-20 MED ORDER — OXYCODONE HCL 5 MG PO TABS
10.0000 mg | ORAL_TABLET | ORAL | Status: DC | PRN
  Administered 2018-09-20: 10 mg via ORAL
  Filled 2018-09-20: qty 2

## 2018-09-20 MED ORDER — MIDAZOLAM HCL 5 MG/5ML IJ SOLN
INTRAMUSCULAR | Status: DC | PRN
  Administered 2018-09-20 (×2): 1 mg via INTRAVENOUS

## 2018-09-20 MED ORDER — NEOMYCIN-POLYMYXIN B GU 40-200000 IR SOLN
Status: DC | PRN
  Administered 2018-09-20: 14 mL

## 2018-09-20 MED ORDER — CEFAZOLIN SODIUM-DEXTROSE 2-4 GM/100ML-% IV SOLN
INTRAVENOUS | Status: AC
  Filled 2018-09-20: qty 100

## 2018-09-20 MED ORDER — FLEET ENEMA 7-19 GM/118ML RE ENEM: 1.0000 | ENEMA | Freq: Once | RECTAL | Status: DC | PRN

## 2018-09-20 MED ORDER — ACETAMINOPHEN 10 MG/ML IV SOLN
INTRAVENOUS | Status: DC | PRN
  Administered 2018-09-20: 1000 mg via INTRAVENOUS

## 2018-09-20 MED ORDER — CARVEDILOL 3.125 MG PO TABS
3.1250 mg | ORAL_TABLET | Freq: Two times a day (BID) | ORAL | Status: DC
  Administered 2018-09-21 – 2018-09-22 (×3): 3.125 mg via ORAL
  Filled 2018-09-20 (×4): qty 1

## 2018-09-20 MED ORDER — CELECOXIB 200 MG PO CAPS
400.0000 mg | ORAL_CAPSULE | Freq: Once | ORAL | Status: AC
  Administered 2018-09-20: 400 mg via ORAL

## 2018-09-20 MED ORDER — TRAZODONE HCL 50 MG PO TABS
50.0000 mg | ORAL_TABLET | Freq: Every day | ORAL | Status: DC
  Administered 2018-09-20 – 2018-09-21 (×2): 50 mg via ORAL
  Filled 2018-09-20 (×2): qty 1

## 2018-09-20 MED ORDER — PHENYLEPHRINE HCL (PRESSORS) 10 MG/ML IV SOLN
INTRAVENOUS | Status: AC
  Filled 2018-09-20: qty 1

## 2018-09-20 MED ORDER — CELECOXIB 200 MG PO CAPS
200.0000 mg | ORAL_CAPSULE | Freq: Two times a day (BID) | ORAL | Status: DC
  Administered 2018-09-20 – 2018-09-22 (×4): 200 mg via ORAL
  Filled 2018-09-20 (×4): qty 1

## 2018-09-20 MED ORDER — TRANEXAMIC ACID-NACL 1000-0.7 MG/100ML-% IV SOLN
1000.0000 mg | Freq: Once | INTRAVENOUS | Status: AC
  Administered 2018-09-20: 1000 mg via INTRAVENOUS
  Filled 2018-09-20: qty 100

## 2018-09-20 MED ORDER — ACETAMINOPHEN 10 MG/ML IV SOLN
1000.0000 mg | Freq: Four times a day (QID) | INTRAVENOUS | Status: AC
  Administered 2018-09-20 – 2018-09-21 (×4): 1000 mg via INTRAVENOUS
  Filled 2018-09-20 (×4): qty 100

## 2018-09-20 MED ORDER — GABAPENTIN 300 MG PO CAPS
ORAL_CAPSULE | ORAL | Status: AC
  Administered 2018-09-20: 300 mg via ORAL
  Filled 2018-09-20: qty 1

## 2018-09-20 MED ORDER — OMEGA-3-ACID ETHYL ESTERS 1 G PO CAPS
1.0000 g | ORAL_CAPSULE | Freq: Every day | ORAL | Status: DC
  Administered 2018-09-21 – 2018-09-22 (×2): 1 g via ORAL
  Filled 2018-09-20 (×2): qty 1

## 2018-09-20 MED ORDER — BUPIVACAINE HCL (PF) 0.5 % IJ SOLN
INTRAMUSCULAR | Status: DC | PRN
  Administered 2018-09-20: 2.5 mL via INTRATHECAL

## 2018-09-20 MED ORDER — BUPIVACAINE HCL (PF) 0.25 % IJ SOLN
INTRAMUSCULAR | Status: DC | PRN
  Administered 2018-09-20: 60 mL

## 2018-09-20 MED ORDER — CHLORHEXIDINE GLUCONATE 4 % EX LIQD: 60.0000 mL | Freq: Once | CUTANEOUS | Status: DC

## 2018-09-20 MED ORDER — MENTHOL 3 MG MT LOZG
1.0000 | LOZENGE | OROMUCOSAL | Status: DC | PRN
  Filled 2018-09-20: qty 9

## 2018-09-20 MED ORDER — PHENOL 1.4 % MT LIQD
1.0000 | OROMUCOSAL | Status: DC | PRN
  Filled 2018-09-20: qty 177

## 2018-09-20 MED ORDER — LACTATED RINGERS IV SOLN
INTRAVENOUS | Status: DC
  Administered 2018-09-20: 50 mL/h via INTRAVENOUS
  Administered 2018-09-20: 10:00:00 via INTRAVENOUS

## 2018-09-20 MED ORDER — SODIUM CHLORIDE 0.9 % IV SOLN
INTRAVENOUS | Status: DC | PRN
  Administered 2018-09-20: 60 mL

## 2018-09-20 MED ORDER — ALUM & MAG HYDROXIDE-SIMETH 200-200-20 MG/5ML PO SUSP: 30.0000 mL | ORAL | Status: DC | PRN

## 2018-09-20 MED ORDER — TRAMADOL HCL 50 MG PO TABS: 50.0000 mg | ORAL_TABLET | ORAL | Status: DC | PRN

## 2018-09-20 MED ORDER — SODIUM CHLORIDE 0.9 % IV SOLN
INTRAVENOUS | Status: DC | PRN
  Administered 2018-09-20: 08:00:00 15 ug/min via INTRAVENOUS

## 2018-09-20 MED ORDER — ONDANSETRON HCL 4 MG/2ML IJ SOLN: 4.0000 mg | Freq: Four times a day (QID) | INTRAMUSCULAR | Status: DC | PRN

## 2018-09-20 MED ORDER — HYDROCHLOROTHIAZIDE 12.5 MG PO CAPS
12.5000 mg | ORAL_CAPSULE | Freq: Every day | ORAL | Status: DC
  Administered 2018-09-21 – 2018-09-22 (×2): 12.5 mg via ORAL
  Filled 2018-09-20 (×2): qty 1

## 2018-09-20 MED ORDER — CITALOPRAM HYDROBROMIDE 20 MG PO TABS
10.0000 mg | ORAL_TABLET | ORAL | Status: DC
  Administered 2018-09-21 – 2018-09-22 (×2): 10 mg via ORAL
  Filled 2018-09-20 (×2): qty 1

## 2018-09-20 MED ORDER — LOSARTAN POTASSIUM 50 MG PO TABS
50.0000 mg | ORAL_TABLET | Freq: Every day | ORAL | Status: DC
  Administered 2018-09-21 – 2018-09-22 (×2): 50 mg via ORAL
  Filled 2018-09-20 (×2): qty 1

## 2018-09-20 MED ORDER — DEXAMETHASONE SODIUM PHOSPHATE 10 MG/ML IJ SOLN
INTRAMUSCULAR | Status: AC
  Administered 2018-09-20: 07:00:00 8 mg via INTRAVENOUS
  Filled 2018-09-20: qty 1

## 2018-09-20 MED ORDER — POTASSIUM CHLORIDE CRYS ER 10 MEQ PO TBCR
10.0000 meq | EXTENDED_RELEASE_TABLET | Freq: Two times a day (BID) | ORAL | Status: DC
  Administered 2018-09-20 – 2018-09-22 (×4): 10 meq via ORAL
  Filled 2018-09-20 (×5): qty 1

## 2018-09-20 MED ORDER — PANTOPRAZOLE SODIUM 40 MG PO TBEC
40.0000 mg | DELAYED_RELEASE_TABLET | Freq: Two times a day (BID) | ORAL | Status: DC
  Administered 2018-09-20 – 2018-09-22 (×5): 40 mg via ORAL
  Filled 2018-09-20 (×5): qty 1

## 2018-09-20 MED ORDER — GABAPENTIN 300 MG PO CAPS
300.0000 mg | ORAL_CAPSULE | Freq: Once | ORAL | Status: AC
  Administered 2018-09-20: 300 mg via ORAL

## 2018-09-20 MED ORDER — CEFAZOLIN SODIUM-DEXTROSE 2-4 GM/100ML-% IV SOLN
2.0000 g | Freq: Four times a day (QID) | INTRAVENOUS | Status: AC
  Administered 2018-09-20 – 2018-09-21 (×4): 2 g via INTRAVENOUS
  Filled 2018-09-20 (×5): qty 100

## 2018-09-20 MED ORDER — CLOBETASOL PROPIONATE 0.05 % EX OINT
1.0000 "application " | TOPICAL_OINTMENT | CUTANEOUS | Status: DC
  Filled 2018-09-20: qty 15

## 2018-09-20 MED ORDER — AMLODIPINE BESYLATE 5 MG PO TABS
5.0000 mg | ORAL_TABLET | Freq: Every day | ORAL | Status: DC
  Administered 2018-09-21 – 2018-09-22 (×2): 5 mg via ORAL
  Filled 2018-09-20 (×2): qty 1

## 2018-09-20 MED ORDER — ACETAMINOPHEN 325 MG PO TABS: 325.0000 mg | ORAL_TABLET | Freq: Four times a day (QID) | ORAL | Status: DC | PRN

## 2018-09-20 MED ORDER — ENOXAPARIN SODIUM 30 MG/0.3ML ~~LOC~~ SOLN
30.0000 mg | Freq: Two times a day (BID) | SUBCUTANEOUS | Status: DC
  Administered 2018-09-21 – 2018-09-22 (×3): 30 mg via SUBCUTANEOUS
  Filled 2018-09-20 (×3): qty 0.3

## 2018-09-20 MED ORDER — GABAPENTIN 300 MG PO CAPS
300.0000 mg | ORAL_CAPSULE | Freq: Every day | ORAL | Status: DC
  Administered 2018-09-20 – 2018-09-21 (×2): 300 mg via ORAL
  Filled 2018-09-20 (×2): qty 1

## 2018-09-20 MED ORDER — DEXAMETHASONE SODIUM PHOSPHATE 10 MG/ML IJ SOLN
INTRAMUSCULAR | Status: AC
  Filled 2018-09-20: qty 1

## 2018-09-20 MED ORDER — MIDAZOLAM HCL 2 MG/2ML IJ SOLN
INTRAMUSCULAR | Status: AC
  Filled 2018-09-20: qty 2

## 2018-09-20 MED ORDER — SODIUM CHLORIDE FLUSH 0.9 % IV SOLN
INTRAVENOUS | Status: AC
  Filled 2018-09-20: qty 40

## 2018-09-20 MED ORDER — DIPHENHYDRAMINE HCL 12.5 MG/5ML PO ELIX: 12.5000 mg | ORAL_SOLUTION | ORAL | Status: DC | PRN

## 2018-09-20 MED ORDER — BUPIVACAINE LIPOSOME 1.3 % IJ SUSP
INTRAMUSCULAR | Status: AC
  Filled 2018-09-20: qty 20

## 2018-09-20 MED ORDER — MAGNESIUM HYDROXIDE 400 MG/5ML PO SUSP
30.0000 mL | Freq: Every day | ORAL | Status: DC
  Administered 2018-09-20 – 2018-09-21 (×2): 30 mL via ORAL
  Filled 2018-09-20 (×2): qty 30

## 2018-09-20 SURGICAL SUPPLY — 68 items
BATTERY INSTRU NAVIGATION (MISCELLANEOUS) ×12 IMPLANT
BLADE SAW 70X12.5 (BLADE) ×3 IMPLANT
BLADE SAW 90X13X1.19 OSCILLAT (BLADE) ×3 IMPLANT
BLADE SAW 90X25X1.19 OSCILLAT (BLADE) ×3 IMPLANT
CANISTER SUCT 3000ML PPV (MISCELLANEOUS) ×3 IMPLANT
CEMENT HV SMART SET (Cement) ×6 IMPLANT
CEMENT TIBIA MBT (Knees) ×1 IMPLANT
COOLER POLAR GLACIER W/PUMP (MISCELLANEOUS) ×3 IMPLANT
COVER WAND RF STERILE (DRAPES) ×3 IMPLANT
CUFF TOURN SGL QUICK 24 (TOURNIQUET CUFF)
CUFF TOURN SGL QUICK 30 (TOURNIQUET CUFF) ×2
CUFF TRNQT CYL 24X4X16.5-23 (TOURNIQUET CUFF) IMPLANT
CUFF TRNQT CYL 30X4X21-28X (TOURNIQUET CUFF) ×1 IMPLANT
DRAPE 3/4 80X56 (DRAPES) ×3 IMPLANT
DRSG DERMACEA 8X12 NADH (GAUZE/BANDAGES/DRESSINGS) ×3 IMPLANT
DRSG OPSITE POSTOP 4X14 (GAUZE/BANDAGES/DRESSINGS) ×3 IMPLANT
DRSG TEGADERM 4X4.75 (GAUZE/BANDAGES/DRESSINGS) ×3 IMPLANT
DURAPREP 26ML APPLICATOR (WOUND CARE) ×6 IMPLANT
ELECT REM PT RETURN 9FT ADLT (ELECTROSURGICAL) ×3
ELECTRODE REM PT RTRN 9FT ADLT (ELECTROSURGICAL) ×1 IMPLANT
EX-PIN ORTHOLOCK NAV 4X150 (PIN) ×6 IMPLANT
GLOVE BIOGEL M STRL SZ7.5 (GLOVE) ×6 IMPLANT
GLOVE INDICATOR 8.0 STRL GRN (GLOVE) ×3 IMPLANT
GOWN STRL REUS W/ TWL LRG LVL3 (GOWN DISPOSABLE) ×2 IMPLANT
GOWN STRL REUS W/TWL LRG LVL3 (GOWN DISPOSABLE) ×4
HEMOVAC 400CC 10FR (MISCELLANEOUS) ×3 IMPLANT
HOLDER FOLEY CATH W/STRAP (MISCELLANEOUS) ×3 IMPLANT
HOOD PEEL AWAY FLYTE STAYCOOL (MISCELLANEOUS) ×6 IMPLANT
IMPL FEMUR SIGMA LT PS SZ 3 (Knees) ×1 IMPLANT
IMPLANT FEMUR SIGMA LT PS SZ 3 (Knees) ×3 IMPLANT
INSERT TIBIAL PFC SIG SZ3 10MM (Knees) ×3 IMPLANT
KIT TURNOVER KIT A (KITS) ×3 IMPLANT
KNIFE SCULPS 14X20 (INSTRUMENTS) ×3 IMPLANT
LABEL OR SOLS (LABEL) ×3 IMPLANT
MANIFOLD NEPTUNE II (INSTRUMENTS) ×3 IMPLANT
NDL SAFETY ECLIPSE 18X1.5 (NEEDLE) ×1 IMPLANT
NEEDLE HYPO 18GX1.5 SHARP (NEEDLE) ×2
NEEDLE SPNL 20GX3.5 QUINCKE YW (NEEDLE) ×6 IMPLANT
NS IRRIG 500ML POUR BTL (IV SOLUTION) ×3 IMPLANT
PACK TOTAL KNEE (MISCELLANEOUS) ×3 IMPLANT
PAD WRAPON POLAR KNEE (MISCELLANEOUS) ×1 IMPLANT
PATELLA DOME PFC 35MM (Knees) ×3 IMPLANT
PENCIL SMOKE ULTRAEVAC 22 CON (MISCELLANEOUS) ×3 IMPLANT
PIN DRILL QUICK PACK ×3 IMPLANT
PIN FIXATION 1/8DIA X 3INL (PIN) ×9 IMPLANT
PULSAVAC PLUS IRRIG FAN TIP (DISPOSABLE) ×3
SLEEVE PROTECTION STRL DISP (MISCELLANEOUS) ×3 IMPLANT
SOL .9 NS 3000ML IRR  AL (IV SOLUTION) ×2
SOL .9 NS 3000ML IRR UROMATIC (IV SOLUTION) ×1 IMPLANT
SOL PREP PVP 2OZ (MISCELLANEOUS) ×3
SOLUTION PREP PVP 2OZ (MISCELLANEOUS) ×1 IMPLANT
SPONGE DRAIN TRACH 4X4 STRL 2S (GAUZE/BANDAGES/DRESSINGS) ×3 IMPLANT
STAPLER SKIN PROX 35W (STAPLE) ×3 IMPLANT
STOCKINETTE IMPERV 14X48 (MISCELLANEOUS) IMPLANT
STRAP TIBIA SHORT (MISCELLANEOUS) ×3 IMPLANT
SUCTION FRAZIER HANDLE 10FR (MISCELLANEOUS) ×2
SUCTION TUBE FRAZIER 10FR DISP (MISCELLANEOUS) ×1 IMPLANT
SUT VIC AB 0 CT1 36 (SUTURE) ×6 IMPLANT
SUT VIC AB 1 CT1 36 (SUTURE) ×6 IMPLANT
SUT VIC AB 2-0 CT2 27 (SUTURE) ×3 IMPLANT
SYR 20ML LL LF (SYRINGE) ×3 IMPLANT
SYR 30ML LL (SYRINGE) ×6 IMPLANT
TIBIA MBT CEMENT (Knees) ×3 IMPLANT
TIP FAN IRRIG PULSAVAC PLUS (DISPOSABLE) ×1 IMPLANT
TOWEL OR 17X26 4PK STRL BLUE (TOWEL DISPOSABLE) ×3 IMPLANT
TOWER CARTRIDGE SMART MIX (DISPOSABLE) ×3 IMPLANT
TRAY FOLEY MTR SLVR 16FR STAT (SET/KITS/TRAYS/PACK) ×3 IMPLANT
WRAPON POLAR PAD KNEE (MISCELLANEOUS) ×3

## 2018-09-20 NOTE — Anesthesia Preprocedure Evaluation (Signed)
Anesthesia Evaluation  Patient identified by MRN, date of birth, ID band Patient awake    Reviewed: Allergy & Precautions, H&P , NPO status , Patient's Chart, lab work & pertinent test results  History of Anesthesia Complications (+) PONV and history of anesthetic complications  Airway Mallampati: III  TM Distance: <3 FB Neck ROM: limited    Dental  (+) Chipped   Pulmonary neg shortness of breath, sleep apnea and Continuous Positive Airway Pressure Ventilation , COPD,           Cardiovascular Exercise Tolerance: Good hypertension, (-) angina(-) Past MI and (-) DOE + dysrhythmias      Neuro/Psych  Headaches, PSYCHIATRIC DISORDERS    GI/Hepatic negative GI ROS, Neg liver ROS,   Endo/Other  negative endocrine ROSHypothyroidism   Renal/GU      Musculoskeletal  (+) Arthritis ,   Abdominal   Peds  Hematology negative hematology ROS (+)   Anesthesia Other Findings Past Medical History: No date: Acquired underactive thyroid No date: Anxiety No date: Arthritis No date: Cancer ([place])     Comment:  skin   Arms and chest No date: Complication of anesthesia     Comment:  dry  heaves No date: COPD (chronic obstructive pulmonary disease) (HCC)     Comment:  mild No date: Depression No date: Dysrhythmia     Comment:  tachyarrythmias No date: Headache No date: Hypertension No date: MRSA (methicillin resistant Staphylococcus aureus)  colonization No date: PONV (postoperative nausea and vomiting) No date: Psoriasis No date: Sleep apnea     Comment:  cpap  Past Surgical History: No date: BREAST EXCISIONAL BIOPSY; Left     Comment:  neg 1970's 1970: BREAST SURGERY; Left     Comment:  cyst removed No date: CHOLECYSTECTOMY 08/12/2018: COLONOSCOPY WITH PROPOFOL; N/A     Comment:  Procedure: COLONOSCOPY WITH PROPOFOL;  Surgeon: Manya Silvas, MD;  Location: [facility] ENDOSCOPY;  Service:   Endoscopy;  Laterality: N/A; No date: EYE SURGERY; Bilateral     Comment:  cataract 2005: JOINT REPLACEMENT; Left     Comment:  hip 2008: JOINT REPLACEMENT; Right     Comment:  hip 2013: JOINT REPLACEMENT; Right     Comment:  knee  BMI    Body Mass Index: 35.88 kg/m      Reproductive/Obstetrics negative OB ROS                             Anesthesia Physical Anesthesia Plan  ASA: III  Anesthesia Plan: Spinal   Post-op Pain Management:    Induction:   PONV Risk Score and Plan:   Airway Management Planned: Natural Airway and Nasal Cannula  Additional Equipment:   Intra-op Plan:   Post-operative Plan:   Informed Consent: I have reviewed the patients History and Physical, chart, labs and discussed the procedure including the risks, benefits and alternatives for the proposed anesthesia with the patient or authorized representative who has indicated his/her understanding and acceptance.     Dental Advisory Given  Plan Discussed with: Anesthesiologist, CRNA and Surgeon  Anesthesia Plan Comments: (Patient reports no bleeding problems and no anticoagulant use.  Plan for spinal with backup GA  Patient consented for risks of anesthesia including but not limited to:  - adverse reactions to medications - risk of bleeding, infection, nerve damage and headache - risk of failed spinal - damage  to teeth, lips or other oral mucosa - sore throat or hoarseness - Damage to heart, brain, lungs or loss of life  Patient voiced understanding.)        Anesthesia Quick Evaluation

## 2018-09-20 NOTE — Anesthesia Procedure Notes (Signed)
Spinal  Patient location during procedure: OR Start time: 09/20/2018 7:20 AM End time: 09/20/2018 7:26 AM Staffing Anesthesiologist: Ata Pecha, Precious Haws, MD Performed: anesthesiologist  Preanesthetic Checklist Completed: patient identified, site marked, surgical consent, pre-op evaluation, timeout performed, IV checked, risks and benefits discussed and monitors and equipment checked Spinal Block Patient position: sitting Prep: ChloraPrep Patient monitoring: heart rate, continuous pulse ox, blood pressure and cardiac monitor Approach: midline Location: L3-4 Injection technique: single-shot Needle Needle type: Whitacre and Introducer  Needle gauge: 24 G Needle length: 9 cm Assessment Sensory level: T10 Additional Notes Negative paresthesia. Negative blood return. Positive free-flowing CSF. Expiration date of kit checked and confirmed. Patient tolerated procedure well, without complications.

## 2018-09-20 NOTE — Anesthesia Post-op Follow-up Note (Signed)
Anesthesia QCDR form completed.        

## 2018-09-20 NOTE — Op Note (Signed)
OPERATIVE NOTE  DATE OF SURGERY:  09/20/2018  PATIENT NAME:  Victoria Flynn   DOB: Apr 18, 1945  MRN: [id number]  PRE-OPERATIVE DIAGNOSIS: Degenerative arthrosis of the left knee, primary  POST-OPERATIVE DIAGNOSIS:  Same  PROCEDURE:  Left total knee arthroplasty using computer-assisted navigation  SURGEON:  Marciano Sequin. M.D.  ASSISTANT:  Maximino Greenland, RN (present and scrubbed throughout the case, critical for assistance with exposure, retraction, instrumentation, and closure)  ANESTHESIA: spinal  ESTIMATED BLOOD LOSS: 50 mL  FLUIDS REPLACED: 1100 mL of crystalloid  TOURNIQUET TIME: 87 minutes  DRAINS: 2 medium Hemovac drains  SOFT TISSUE RELEASES: Anterior cruciate ligament, posterior cruciate ligament, deep medial collateral ligament, patellofemoral ligament  IMPLANTS UTILIZED: DePuy PFC Sigma size 3 posterior stabilized femoral component (cemented), size 3 MBT tibial component (cemented), 35 mm 3 peg oval dome patella (cemented), and a 10 mm stabilized rotating platform polyethylene insert.  INDICATIONS FOR SURGERY: Victoria Flynn is a 73 y.o. year old female with a long history of progressive knee pain. X-rays demonstrated severe degenerative changes in tricompartmental fashion. The patient had not seen any significant improvement despite conservative nonsurgical intervention. After discussion of the risks and benefits of surgical intervention, the patient expressed understanding of the risks benefits and agree with plans for total knee arthroplasty.   The risks, benefits, and alternatives were discussed at length including but not limited to the risks of infection, bleeding, nerve injury, stiffness, blood clots, the need for revision surgery, cardiopulmonary complications, among others, and they were willing to proceed.  PROCEDURE IN DETAIL: The patient was brought into the operating room and, after adequate spinal anesthesia was achieved, a tourniquet was placed on the patient's  upper thigh. The patient's knee and leg were cleaned and prepped with alcohol and DuraPrep and draped in the usual sterile fashion. A "timeout" was performed as per usual protocol. The lower extremity was exsanguinated using an Esmarch, and the tourniquet was inflated to 300 mmHg. An anterior longitudinal incision was made followed by a standard mid vastus approach. The deep fibers of the medial collateral ligament were elevated in a subperiosteal fashion off of the medial flare of the tibia so as to maintain a continuous soft tissue sleeve. The patella was subluxed laterally and the patellofemoral ligament was incised. Inspection of the knee demonstrated severe degenerative changes with full-thickness loss of articular cartilage. Osteophytes were debrided using a rongeur. Anterior and posterior cruciate ligaments were excised. Two 4.0 mm Schanz pins were inserted in the femur and into the tibia for attachment of the array of trackers used for computer-assisted navigation. Hip center was identified using a circumduction technique. Distal landmarks were mapped using the computer. The distal femur and proximal tibia were mapped using the computer. The distal femoral cutting guide was positioned using computer-assisted navigation so as to achieve a 5 distal valgus cut. The femur was sized and it was felt that a size 3 femoral component was appropriate. A size 3 femoral cutting guide was positioned and the anterior cut was performed and verified using the computer. This was followed by completion of the posterior and chamfer cuts. Femoral cutting guide for the central box was then positioned in the center box cut was performed.  Attention was then directed to the proximal tibia. Medial and lateral menisci were excised. The extramedullary tibial cutting guide was positioned using computer-assisted navigation so as to achieve a 0 varus-valgus alignment and 0 posterior slope. The cut was performed and verified using  the computer. The  proximal tibia was sized and it was felt that a size 3 tibial tray was appropriate. Tibial and femoral trials were inserted followed by insertion of a 10 mm polyethylene insert. This allowed for excellent mediolateral soft tissue balancing both in flexion and in full extension. Finally, the patella was cut and prepared so as to accommodate a 35 mm 3 peg oval dome patella. A patella trial was placed and the knee was placed through a range of motion with excellent patellar tracking appreciated. The femoral trial was removed after debridement of posterior osteophytes. The central post-hole for the tibial component was reamed followed by insertion of a keel punch. Tibial trials were then removed. Cut surfaces of bone were irrigated with copious amounts of normal saline with antibiotic solution using pulsatile lavage and then suctioned dry. Polymethylmethacrylate cement was prepared in the usual fashion using a vacuum mixer. Cement was applied to the cut surface of the proximal tibia as well as along the undersurface of a size 3 MBT tibial component. Tibial component was positioned and impacted into place. Excess cement was removed using [employer] fast streamer. Cement was then applied to the cut surfaces of the femur as well as along the posterior flanges of the size 3 femoral component. The femoral component was positioned and impacted into place. Excess cement was removed using [employer] fast streamer. A 10 mm polyethylene trial was inserted and the knee was brought into full extension with steady axial compression applied. Finally, cement was applied to the backside of a 35 mm 3 peg oval dome patella and the patellar component was positioned and patellar clamp applied. Excess cement was removed using [employer] fast streamer. After adequate curing of the cement, the tourniquet was deflated after a total tourniquet time of 87 minutes. Hemostasis was achieved using electrocautery. The knee was irrigated with copious amounts  of normal saline with antibiotic solution using pulsatile lavage and then suctioned dry. 20 mL of 1.3% Exparel and 60 mL of 0.25% Marcaine in 40 mL of normal saline was injected along the posterior capsule, medial and lateral gutters, and along the arthrotomy site. A 10 mm stabilized rotating platform polyethylene insert was inserted and the knee was placed through a range of motion with excellent mediolateral soft tissue balancing appreciated and excellent patellar tracking noted. 2 medium drains were placed in the wound bed and brought out through separate stab incisions. The medial parapatellar portion of the incision was reapproximated using interrupted sutures of #1 Vicryl. Subcutaneous tissue was approximated in layers using first #0 Vicryl followed #2-0 Vicryl. The skin was approximated with skin staples. A sterile dressing was applied.  The patient tolerated the procedure well and was transported to the recovery room in stable condition.    Hiroyuki Ozanich P. Holley Bouche., M.D.

## 2018-09-20 NOTE — Progress Notes (Signed)
Patient refusing bone foam, states it hurts.

## 2018-09-20 NOTE — Evaluation (Signed)
Physical Therapy Evaluation Patient Details Name: Victoria Flynn MRN: [id number] DOB: 07/30/1945 Today's Date: 09/20/2018   History of Present Illness  APtient is 73 yo female s/p  L TKA, WBAT. PMH of bilateral THA and R TKA, COPD, HTN.  Clinical Impression  Patient alert, oriented x4 reported pain of 4/10. Pt stated at baseline she is independent, lives with husband in one story home.   Patient was able to perform therapeutic exercises with verbal and tactile cues, physical assist for heel slides but able to perform SLR without physical assist bilaterally. Pt still with returning of sensation, fair quad activation noted. Pt performed supine to sit with CGA and extended time, and sit <> stand with RW and minA. Ambulated ~33ft to chair with RW and CGA, one mild LOB posteriorly when turning, minA from PT to correct.  Overall the patient demonstrated deficits (see "PT Problem List") that impede the patient's functional abilities, safety, and mobility and would benefit from skilled PT intervention. Recommendation is HHPT pending pt progress.      Follow Up Recommendations Home health PT    Equipment Recommendations  None recommended by PT;Other (comment)(Pt has RW at home)    Recommendations for Other Services       Precautions / Restrictions Precautions Precautions: Knee Precaution Booklet Issued: Yes (comment) Restrictions Weight Bearing Restrictions: Yes LLE Weight Bearing: Weight bearing as tolerated      Mobility  Bed Mobility Overal bed mobility: Needs Assistance Bed Mobility: Supine to Sit     Supine to sit: Min guard     General bed mobility comments: use of bed rails  Transfers Overall transfer level: Needs assistance Equipment used: Rolling walker (2 wheeled) Transfers: Sit to/from Stand Sit to Stand: Min assist;From elevated surface            Ambulation/Gait Ambulation/Gait assistance: Min guard;Min Web designer (Feet): 2 Feet Assistive device:  Rolling walker (2 wheeled)       General Gait Details: step to gait pattern, verbal cues for sequencing, CGA except for one small LOB posteriorly, minA from PT to correct.  Stairs            Wheelchair Mobility    Modified Rankin (Stroke Patients Only)       Balance Overall balance assessment: Needs assistance Sitting-balance support: Feet supported Sitting balance-Leahy Scale: Fair       Standing balance-Leahy Scale: Poor Standing balance comment: reliant on walker                             Pertinent Vitals/Pain Pain Assessment: 0-10 Pain Score: 4  Pain Location: L knee Pain Descriptors / Indicators: Sore Pain Intervention(s): Limited activity within patient's tolerance;Monitored during session;Repositioned;Ice applied    Home Living Family/patient expects to be discharged to:: Private residence Living Arrangements: Spouse/significant other Available Help at Discharge: Family Type of Home: House Home Access: Stairs to enter Entrance Stairs-Rails: Right Entrance Stairs-Number of Steps: 3 Home Layout: One level Home Equipment: Environmental consultant - 2 wheels;Cane - single point;Shower seat - built in;Grab bars - toilet;Grab bars - tub/shower      Prior Function Level of Independence: Independent               Hand Dominance   Dominant Hand: Right    Extremity/Trunk Assessment   Upper Extremity Assessment Upper Extremity Assessment: Overall WFL for tasks assessed    Lower Extremity Assessment Lower Extremity Assessment: RLE deficits/detail;LLE deficits/detail RLE Deficits /  Details: able to perform SLR without issue, heel slide LLE Deficits / Details: able to perform SLR without physical assist s/p L TKA, still resolving sensation    Cervical / Trunk Assessment Cervical / Trunk Assessment: Normal  Communication   Communication: No difficulties  Cognition Arousal/Alertness: Awake/alert Behavior During Therapy: WFL for tasks  assessed/performed Overall Cognitive Status: Within Functional Limits for tasks assessed                                        General Comments      Exercises Total Joint Exercises Ankle Circles/Pumps: AROM;Both;10 reps Quad Sets: AROM;Left;10 reps Gluteal Sets: AROM;Both;10 reps Heel Slides: AAROM;Left;10 reps Straight Leg Raises: AROM;Both;10 reps Goniometric ROM: 5-50   Assessment/Plan    PT Assessment Patient needs continued PT services  PT Problem List Decreased strength;Decreased range of motion;Decreased knowledge of use of DME;Decreased activity tolerance;Decreased balance;Decreased mobility;Pain;Decreased knowledge of precautions       PT Treatment Interventions DME instruction;Balance training;Gait training;Stair training;Neuromuscular re-education;Functional mobility training;Therapeutic activities;Patient/family education;Therapeutic exercise    PT Goals (Current goals can be found in the Care Plan section)  Acute Rehab PT Goals Patient Stated Goal: to go home PT Goal Formulation: With patient Time For Goal Achievement: 10/04/18 Potential to Achieve Goals: Good    Frequency BID   Barriers to discharge        Co-evaluation               AM-PAC PT "6 Clicks" Mobility  Outcome Measure Help needed turning from your back to your side while in a flat bed without using bedrails?: A Little Help needed moving from lying on your back to sitting on the side of a flat bed without using bedrails?: A Little Help needed moving to and from a bed to a chair (including a wheelchair)?: A Little Help needed standing up from a chair using your arms (e.g., wheelchair or bedside chair)?: A Little Help needed to walk in hospital room?: A Little Help needed climbing 3-5 steps with a railing? : A Lot 6 Click Score: 17    End of Session Equipment Utilized During Treatment: Gait belt Activity Tolerance: Patient tolerated treatment well Patient left: in  chair;with call bell/phone within reach;with chair alarm set Nurse Communication: Mobility status PT Visit Diagnosis: Other abnormalities of gait and mobility (R26.89);Difficulty in walking, not elsewhere classified (R26.2);Muscle weakness (generalized) (M62.81);Pain Pain - Right/Left: Left Pain - part of body: Knee    Time: 1541-1611 PT Time Calculation (min) (ACUTE ONLY): 30 min   Charges:   PT Evaluation $PT Eval Low Complexity: 1 Low PT Treatments $Therapeutic Exercise: 8-22 mins       Lieutenant Diego PT, DPT 4:23 PM,09/20/18 [phone]

## 2018-09-20 NOTE — Progress Notes (Signed)
Second sign done.

## 2018-09-20 NOTE — Transfer of Care (Signed)
Immediate Anesthesia Transfer of Care Note  Patient: Victoria Flynn  Procedure(s) Performed: COMPUTER ASSISTED TOTAL KNEE ARTHROPLASTY - RNFA (Left Knee)  Patient Location: PACU  Anesthesia Type:General  Level of Consciousness: awake, alert , oriented and patient cooperative  Airway & Oxygen Therapy: Patient Spontanous Breathing and Patient connected to face mask oxygen  Post-op Assessment: Report given to RN and Post -op Vital signs reviewed and stable  Post vital signs: Reviewed and stable  Last Vitals:  Vitals Value Taken Time  BP 156/78 09/20/18 1050  Temp 36.5 C 09/20/18 1050  Pulse 58 09/20/18 1052  Resp 22 09/20/18 1052  SpO2 98 % 09/20/18 1052  Vitals shown include unvalidated device data.  Last Pain:  Vitals:   09/20/18 0626  TempSrc: Tympanic  PainSc: 6       Patients Stated Pain Goal: 1 (61/53/79 4327)  Complications: No apparent anesthesia complications

## 2018-09-20 NOTE — Addendum Note (Signed)
Addended by: Marciano Sequin. on: 09/20/2018 11:06 AM   Modules accepted: Orders

## 2018-09-21 NOTE — Progress Notes (Signed)
  Subjective: 1 Day Post-Op Procedure(s) (LRB): COMPUTER ASSISTED TOTAL KNEE ARTHROPLASTY - RNFA (Left) Patient reports pain as mild.   Patient is well, and has had no acute complaints or problems Plan is to go Home after hospital stay. Negative for chest pain and shortness of breath Fever: no Gastrointestinal:Negative for nausea and vomiting  Objective: Vital signs in last 24 hours: Temp:  [97.6 F (36.4 C)-98.8 F (37.1 C)] 97.9 F (36.6 C) (08/01 0404) Pulse Rate:  [50-65] 65 (08/01 0404) Resp:  [14-22] 16 (08/01 0404) BP: (142-172)/(65-82) 151/75 (08/01 0404) SpO2:  [91 %-99 %] 95 % (08/01 0404)  Intake/Output from previous day:  Intake/Output Summary (Last 24 hours) at 09/21/2018 0709 Last data filed at 09/21/2018 0641 Gross per 24 hour  Intake 3316.84 ml  Output 4465 ml  Net -1148.16 ml    Intake/Output this shift: No intake/output data recorded.  Labs: No results for input(s): HGB in the last 72 hours. No results for input(s): WBC, RBC, HCT, PLT in the last 72 hours. No results for input(s): NA, K, CL, CO2, BUN, CREATININE, GLUCOSE, CALCIUM in the last 72 hours. No results for input(s): LABPT, INR in the last 72 hours.   EXAM General - Patient is Alert, Appropriate and Oriented Extremity - ABD soft Sensation intact distally Intact pulses distally Dorsiflexion/Plantar flexion intact Incision: dressing C/D/I  Negative Homan's to the left leg. Dressing/Incision - Bulky dressing intact to the left leg, hemovac intact with minimal bloody drainage. Motor Function - intact, moving foot and toes well on exam.   Past Medical History:  Diagnosis Date  . Acquired underactive thyroid   . Anxiety   . Arthritis   . Cancer ([place])    skin   Arms and chest  . Complication of anesthesia    dry  heaves  . COPD (chronic obstructive pulmonary disease) ([facility])    mild  . Depression   . Dysrhythmia    tachyarrythmias  . Headache   . Hypertension   . MRSA (methicillin  resistant Staphylococcus aureus) colonization   . PONV (postoperative nausea and vomiting)   . Psoriasis   . Sleep apnea    cpap    Assessment/Plan: 1 Day Post-Op Procedure(s) (LRB): COMPUTER ASSISTED TOTAL KNEE ARTHROPLASTY - RNFA (Left) Active Problems:   Total knee replacement status  Estimated body mass index is 35.88 kg/m as calculated from the following:   Height as of this encounter: 5' 1.5" (1.562 m).   Weight as of this encounter: 87.5 kg. Advance diet Up with therapy D/C IV fluids when tolerating po intake.  Pt was able to walk 2 feet yesterday, up with therapy today. Bulky dressing intact with hemovac, will remove dressing and drain tomorrow morning. Begin working on a BM, states she is passing gas this morning. Plan will be for d/c home tomorrow with HHPT.  DVT Prophylaxis - Lovenox, Foot Pumps and TED hose Weight-Bearing as tolerated to left leg  J. Cameron Proud, PA-C [facility] Orthopaedic Surgery 09/21/2018, 7:09 AM

## 2018-09-21 NOTE — Progress Notes (Signed)
Physical Therapy Treatment Patient Details Name: Victoria Flynn MRN: [id number] DOB: January 30, 1946 Today's Date: 09/21/2018    History of Present Illness Patient is 73 yo female s/p  L TKA, WBAT. PMH of bilateral THA and R TKA, COPD, HTN.    PT Comments    In recliner, needing to void again.  Amb 10' to commode then was able to continue 100' just shy of nursing stating and back to commode before returning to recliner.  Participated in exercises as described below.  Progressing well with gait and mobility.  Generally steady gait but did have one small LOB while attending to self care after commode.   Follow Up Recommendations  Home health PT     Equipment Recommendations  None recommended by PT;Other (comment)    Recommendations for Other Services       Precautions / Restrictions Precautions Precautions: Knee Restrictions Weight Bearing Restrictions: Yes LLE Weight Bearing: Weight bearing as tolerated    Mobility  Bed Mobility Overal bed mobility: Needs Assistance Bed Mobility: Supine to Sit     Supine to sit: Min assist     General bed mobility comments: up in recliner and remained up after session  Transfers Overall transfer level: Needs assistance Equipment used: Rolling walker (2 wheeled) Transfers: Sit to/from Stand Sit to Stand: Min assist;Min guard            Ambulation/Gait Ambulation/Gait assistance: Min guard;Min assist Gait Distance (Feet): 100 Feet Assistive device: Rolling walker (2 wheeled) Gait Pattern/deviations: Step-to pattern Gait velocity: decreased   General Gait Details: one LOB while attempting self care recovered with min a.   Stairs             Wheelchair Mobility    Modified Rankin (Stroke Patients Only)       Balance Overall balance assessment: Needs assistance Sitting-balance support: Feet supported Sitting balance-Leahy Scale: Fair     Standing balance support: Bilateral upper extremity supported Standing  balance-Leahy Scale: Fair Standing balance comment: does well with B U Esupport but one LOB with care after commode                            Cognition Arousal/Alertness: Awake/alert Behavior During Therapy: WFL for tasks assessed/performed Overall Cognitive Status: Within Functional Limits for tasks assessed                                        Exercises Total Joint Exercises Ankle Circles/Pumps: AROM;Both;10 reps Heel Slides: AAROM;Left;10 reps Hip ABduction/ADduction: AAROM;Left;10 reps Straight Leg Raises: AROM;10 reps;AAROM;Left Long Arc Quad: AROM;Left;10 reps Knee Flexion: AAROM;Left;5 reps Goniometric ROM: 5-70 limited by bulky dressing Other Exercises Other Exercises: to commode before and after gait to void    General Comments        Pertinent Vitals/Pain Pain Assessment: 0-10 Faces Pain Scale: Hurts a little bit Pain Location: L knee - more discomfort in bladder due to needing to void. Pain Descriptors / Indicators: Sore Pain Intervention(s): Limited activity within patient's tolerance;Monitored during session    Home Living                      Prior Function            PT Goals (current goals can now be found in the care plan section) Progress towards PT goals: Progressing toward goals  Frequency    BID      PT Plan Current plan remains appropriate    Co-evaluation              AM-PAC PT "6 Clicks" Mobility   Outcome Measure  Help needed turning from your back to your side while in a flat bed without using bedrails?: A Little Help needed moving from lying on your back to sitting on the side of a flat bed without using bedrails?: A Little Help needed moving to and from a bed to a chair (including a wheelchair)?: A Little Help needed standing up from a chair using your arms (e.g., wheelchair or bedside chair)?: A Little Help needed to walk in hospital room?: A Little Help needed climbing 3-5 steps  with a railing? : A Little 6 Click Score: 18    End of Session Equipment Utilized During Treatment: Gait belt Activity Tolerance: Patient tolerated treatment well Patient left: in chair;with call bell/phone within reach;with chair alarm set Nurse Communication: Mobility status Pain - Right/Left: Left Pain - part of body: Knee     Time: 1338-1401 PT Time Calculation (min) (ACUTE ONLY): 23 min  Charges:  $Gait Training: 8-22 mins $Therapeutic Exercise: 8-22 mins $Therapeutic Activity: 8-22 mins                    Chesley Noon, PTA 09/21/18, 3:24 PM

## 2018-09-21 NOTE — Evaluation (Signed)
Occupational Therapy Evaluation Patient Details Name: Victoria Flynn MRN: [id number] DOB: [birth year] Today's Date: 09/21/2018    History of Present Illness Patient is 73 yo female s/p  L TKA, WBAT. PMH of bilateral THA and R TKA, COPD, HTN.   Clinical Impression   Pt is 73 year old s/p L TKA with WBAT.  Pt was independent in all ADLs prior to surgery and is eager to return to PLOF.  She has had both hips replaced and R knee in 2013 and has all needed AD for dressing and bathing at home and feels she is well educated in use of them.  Reviewed easier way to don Ted hose.  Pt currently requires min assist for LB dressing while in seated position due to pain and limited AROM of L knee.  Pt would benefit from instruction in toilet transfers prior to discharge home.  No further OT needed after discharge home.      Follow Up Recommendations  No OT follow up    Equipment Recommendations       Recommendations for Other Services       Precautions / Restrictions Precautions Precautions: Knee Restrictions Weight Bearing Restrictions: Yes LLE Weight Bearing: Weight bearing as tolerated      Mobility Bed Mobility                  Transfers                      Balance                                           ADL either performed or assessed with clinical judgement   ADL Overall ADL's : Needs assistance/impaired Eating/Feeding: Independent;Set up   Grooming: Wash/dry hands;Wash/dry face;Oral care;Applying deodorant;Brushing hair;Set up;Independent           Upper Body Dressing : Independent;Set up   Lower Body Dressing: Set up;Minimal assistance;With adaptive equipment                 General ADL Comments: Pt has had both hips replaced and R Knee 7 years ago and has all AD to use for dressing at home.  She currently requires min assist for using AD due to pain and decreased AROM in LLE.  She would benefit from further training with toilet  transfers before going home but overall is doing well with use of AD for dressing.  Rec using a bread bag when donning Ted hose and reviewed how to do this during this session.     Vision Baseline Vision/History: Wears glasses Wears Glasses: At all times Patient Visual Report: No change from baseline       Perception     Praxis      Pertinent Vitals/Pain Pain Assessment: 0-10 Pain Score: 2  Pain Location: L knee Pain Descriptors / Indicators: Sore Pain Intervention(s): Monitored during session     Hand Dominance Right   Extremity/Trunk Assessment Upper Extremity Assessment Upper Extremity Assessment: Overall WFL for tasks assessed   Lower Extremity Assessment Lower Extremity Assessment: Defer to PT evaluation   Cervical / Trunk Assessment Cervical / Trunk Assessment: Normal   Communication Communication Communication: No difficulties   Cognition Arousal/Alertness: Awake/alert Behavior During Therapy: WFL for tasks assessed/performed Overall Cognitive Status: Within Functional Limits for tasks assessed  General Comments       Exercises     Shoulder Instructions      Home Living Family/patient expects to be discharged to:: Private residence Living Arrangements: Spouse/significant other Available Help at Discharge: Family Type of Home: House Home Access: Stairs to enter Technical brewer of Steps: 3 Entrance Stairs-Rails: Right Home Layout: One level     Bathroom Shower/Tub: Occupational psychologist: [place]: Environmental consultant - 2 wheels;Cane - single point;Shower seat - built in;Grab bars - toilet;Grab bars - tub/shower          Prior Functioning/Environment Level of Independence: Independent                 OT Problem List: Decreased strength;Decreased range of motion;Decreased activity tolerance;Pain      OT Treatment/Interventions: Self-care/ADL  training;Patient/family education    OT Goals(Current goals can be found in the care plan section) Acute Rehab OT Goals Patient Stated Goal: to go home OT Goal Formulation: With patient Time For Goal Achievement: 09/28/18 Potential to Achieve Goals: Good ADL Goals Pt Will Perform Lower Body Dressing: with set-up;with supervision;with adaptive equipment;sit to/from stand Pt Will Transfer to Toilet: with set-up;with min guard assist;stand pivot transfer;regular height toilet  OT Frequency: Min 1X/week   Barriers to D/C:            Co-evaluation              AM-PAC OT "6 Clicks" Daily Activity     Outcome Measure Help from another person eating meals?: None Help from another person taking care of personal grooming?: None Help from another person toileting, which includes using toliet, bedpan, or urinal?: A Little Help from another person bathing (including washing, rinsing, drying)?: A Little Help from another person to put on and taking off regular upper body clothing?: None Help from another person to put on and taking off regular lower body clothing?: A Little 6 Click Score: 21   End of Session    Activity Tolerance: Patient tolerated treatment well Patient left: in bed;with call bell/phone within reach;with bed alarm set  OT Visit Diagnosis: Unsteadiness on feet (R26.81);Muscle weakness (generalized) (M62.81);Pain Pain - Right/Left: Left Pain - part of body: Knee                Time: 0900-0920 OT Time Calculation (min): 20 min Charges:  OT General Charges $OT Visit: 1 Visit OT Evaluation $OT Eval Low Complexity: 1 Low  Chrys Racer, OTR/L, [place] ascom [phone] 09/21/18, 9:32 AM

## 2018-09-21 NOTE — Progress Notes (Signed)
Physical Therapy Treatment Patient Details Name: Victoria Flynn MRN: [id number] DOB: 1945-10-26 Today's Date: 09/21/2018    History of Present Illness Patient is 73 yo female s/p  L TKA, WBAT. PMH of bilateral THA and R TKA, COPD, HTN.    PT Comments    Pt in bed.  RN reports large amount of urine in bladder when scanned.  Difficulty voiding in bed.  Pt generally uncomfortable upon arrival.  Encouraged out of bed to commode and she agreed.  Min a for bed mobility.  Transferred to commode and then amb 4' to recliner with walker and min guard.  Overall does well with good results on commode.  Pt feeling better.  Remained in recliner with lunch tray.  Will continue with mobility and exercises after lunch.   Follow Up Recommendations  Home health PT     Equipment Recommendations  None recommended by PT;Other (comment)    Recommendations for Other Services       Precautions / Restrictions Precautions Precautions: Knee Restrictions Weight Bearing Restrictions: Yes LLE Weight Bearing: Weight bearing as tolerated    Mobility  Bed Mobility Overal bed mobility: Needs Assistance Bed Mobility: Supine to Sit     Supine to sit: Min assist     General bed mobility comments: use of bed rails  Transfers Overall transfer level: Needs assistance Equipment used: Rolling walker (2 wheeled) Transfers: Sit to/from Stand Sit to Stand: Min assist            Ambulation/Gait Ambulation/Gait assistance: Min guard;Min Web designer (Feet): 4 Feet Assistive device: Rolling walker (2 wheeled) Gait Pattern/deviations: Step-to pattern Gait velocity: decreased       Stairs             Wheelchair Mobility    Modified Rankin (Stroke Patients Only)       Balance Overall balance assessment: Needs assistance Sitting-balance support: Feet supported Sitting balance-Leahy Scale: Fair     Standing balance support: Bilateral upper extremity supported Standing balance-Leahy  Scale: Fair Standing balance comment: reliant on walker                            Cognition Arousal/Alertness: Awake/alert Behavior During Therapy: WFL for tasks assessed/performed Overall Cognitive Status: Within Functional Limits for tasks assessed                                        Exercises      General Comments        Pertinent Vitals/Pain Pain Assessment: Faces Pain Score: 2  Faces Pain Scale: Hurts a little bit Pain Location: L knee - more discomfort in bladder due to needing to void. Pain Descriptors / Indicators: Sore Pain Intervention(s): Limited activity within patient's tolerance;Monitored during session;Repositioned    Home Living Family/patient expects to be discharged to:: Private residence Living Arrangements: Spouse/significant other Available Help at Discharge: Family Type of Home: House Home Access: Stairs to enter Entrance Stairs-Rails: Right Home Layout: One level Home Equipment: Environmental consultant - 2 wheels;Cane - single point;Shower seat - built in;Grab bars - toilet;Grab bars - tub/shower      Prior Function Level of Independence: Independent          PT Goals (current goals can now be found in the care plan section) Acute Rehab PT Goals Patient Stated Goal: to go home Progress towards PT  goals: Progressing toward goals    Frequency    BID      PT Plan Current plan remains appropriate    Co-evaluation              AM-PAC PT "6 Clicks" Mobility   Outcome Measure  Help needed turning from your back to your side while in a flat bed without using bedrails?: A Little Help needed moving from lying on your back to sitting on the side of a flat bed without using bedrails?: A Little Help needed moving to and from a bed to a chair (including a wheelchair)?: A Little Help needed standing up from a chair using your arms (e.g., wheelchair or bedside chair)?: A Little Help needed to walk in hospital room?: A  Little Help needed climbing 3-5 steps with a railing? : A Lot 6 Click Score: 17    End of Session Equipment Utilized During Treatment: Gait belt Activity Tolerance: Patient tolerated treatment well Patient left: in chair;with call bell/phone within reach;with chair alarm set Nurse Communication: Mobility status;Other (comment) Pain - Right/Left: Left Pain - part of body: Knee     Time: 8119-1478 PT Time Calculation (min) (ACUTE ONLY): 20 min  Charges:  $Therapeutic Activity: 8-22 mins                     Chesley Noon, PTA 09/21/18, 1:24 PM

## 2018-09-21 NOTE — Plan of Care (Signed)

## 2018-09-21 NOTE — TOC Initial Note (Signed)
Transition of Care [facility]) - Initial/Assessment Note    Patient Details  Name: Victoria Flynn MRN: [id number] Date of Birth: [birth year]  Transition of [facility]) CM/SW Contact:    Latanya Maudlin, RN Phone Number: 09/21/2018, 9:18 AM  Clinical Narrative:  [facility] team consulted to assist with disposition. Patient is from home with her spouse and independent at baseline but is familiar with home health since she has had hip replacement before. She already has bedside commode and rolling walker in home. CMS Medicare.gov Compare Post Acute Care list reviewed with patient and she prefers [facility]. Referral given to [facility] Likely for discharge tomorrow.                 Expected Discharge Plan: [place] Barriers to Discharge: Continued Medical Work up   Patient Goals and CMS Choice Patient states their goals for this hospitalization and ongoing recovery are:: to go home CMS Medicare.gov Compare Post Acute Care list provided to:: Patient Choice offered to / list presented to : Patient  Expected Discharge Plan and Services Expected Discharge Plan: [place]   Discharge Planning Services: CM Consult Post Acute Care Choice: [place] arrangements for the past 2 months: Single Family Home                           HH Arranged: PT [place]: [facility] [employer]) Date [place]: 09/21/18 Time [place]: [phone] Representative spoke with at [place]: [place] Arrangements/Services Living arrangements for the past 2 months: [place] Lives with:: Spouse Patient language and need for interpreter reviewed:: Yes Do you feel safe going back to the place where you live?: Yes      Need for Family Participation in Patient Care: No (Comment)   Current home services: DME Criminal Activity/Legal Involvement Pertinent to Current Situation/Hospitalization: No - Comment as  needed  Activities of Daily Living Home Assistive Devices/Equipment: Walker (specify type), CPAP, Eyeglasses ADL Screening (condition at time of admission) Patient's cognitive ability adequate to safely complete daily activities?: Yes Is the patient deaf or have difficulty hearing?: No Does the patient have difficulty seeing, even when wearing glasses/contacts?: No Does the patient have difficulty concentrating, remembering, or making decisions?: No Patient able to express need for assistance with ADLs?: Yes Does the patient have difficulty dressing or bathing?: No Independently performs ADLs?: Yes (appropriate for developmental age) Does the patient have difficulty walking or climbing stairs?: Yes Weakness of Legs: Both Weakness of Arms/Hands: None  Permission Sought/Granted                  Emotional Assessment Appearance:: Appears stated age Attitude/Demeanor/Rapport: Engaged, Ambitious Affect (typically observed): Pleasant Orientation: : Oriented to Self, Oriented to Place, Oriented to  Time, Oriented to Situation      Admission diagnosis:  Total knee replacement status [Z96.659] Patient Active Problem List   Diagnosis Date Noted  . Total knee replacement status 09/20/2018  . Primary osteoarthritis of left knee 07/12/2018  . History of constipation 03/15/2018  . History of colonic polyps 03/15/2018  . Chronic gouty arthritis 03/14/2016  . Status post total replacement of both hips 05/30/2015  . Status post total right knee replacement 05/30/2015  . Recurrent major depressive disorder, in full remission ([place]) 05/21/2015  . Hyperlipidemia 12/25/2013  . Hypothyroidism 12/25/2013  . Psoriasis 12/25/2013  . Hypertension  07/28/2013  . Sleep apnea 07/28/2013   PCP:  Adin Hector, MD Pharmacy:   [facility] [address] [place] [address] Phone: [phone] Fax: [place], [place] [address] [place] [address] Phone: [phone] Fax: [phone]     Social Determinants of Health (SDOH) Interventions    Readmission Risk Interventions Readmission Risk Prevention Plan 09/21/2018  Post Dischage Appt Complete  Medication Screening Complete  Transportation Screening Complete  Some recent data might be hidden

## 2018-09-21 NOTE — Anesthesia Postprocedure Evaluation (Signed)
Anesthesia Post Note  Patient: Victoria Flynn  Procedure(s) Performed: COMPUTER ASSISTED TOTAL KNEE ARTHROPLASTY - RNFA (Left Knee)  Patient location during evaluation: Other Anesthesia Type: Spinal Level of consciousness: awake and alert Pain management: pain level controlled Respiratory status: spontaneous breathing, nonlabored ventilation and respiratory function stable Cardiovascular status: stable Postop Assessment: spinal receding, no headache, no backache and able to ambulate Anesthetic complications: no     Last Vitals:  Vitals:   09/21/18 0800 09/21/18 1100  BP: (!) 185/81 (!) 175/81  Pulse: 62 61  Resp: 16   Temp: 36.6 C   SpO2: 94%     Last Pain:  Vitals:   09/21/18 0900  TempSrc:   PainSc: 0-No pain                 Durenda Hurt

## 2018-09-22 MED ORDER — ENOXAPARIN SODIUM 40 MG/0.4ML ~~LOC~~ SOLN: 40.0000 mg | SUBCUTANEOUS | 0 refills | Status: DC

## 2018-09-22 MED ORDER — OXYCODONE HCL 5 MG PO TABS: 5.0000 mg | ORAL_TABLET | ORAL | 0 refills | Status: DC | PRN

## 2018-09-22 MED ORDER — TRAMADOL HCL 50 MG PO TABS: 50.0000 mg | ORAL_TABLET | Freq: Four times a day (QID) | ORAL | 0 refills | Status: DC | PRN

## 2018-09-22 NOTE — Plan of Care (Signed)

## 2018-09-22 NOTE — Discharge Summary (Signed)
Physician Discharge Summary  Patient ID: Victoria Flynn MRN: [id number] DOB/AGE: 75-Mar-1947 75 y.o.  Admit date: 09/20/2018 Discharge date: 09/22/2018  Admission Diagnoses:  Total knee replacement status [Z96.659] Degenerative arthrosis of the left knee.  Discharge Diagnoses: Patient Active Problem List   Diagnosis Date Noted  . Total knee replacement status 09/20/2018  . Primary osteoarthritis of left knee 07/12/2018  . History of constipation 03/15/2018  . History of colonic polyps 03/15/2018  . Chronic gouty arthritis 03/14/2016  . Status post total replacement of both hips 05/30/2015  . Status post total right knee replacement 05/30/2015  . Recurrent major depressive disorder, in full remission ([place]) 05/21/2015  . Hyperlipidemia 12/25/2013  . Hypothyroidism 12/25/2013  . Psoriasis 12/25/2013  . Hypertension 07/28/2013  . Sleep apnea 07/28/2013    Past Medical History:  Diagnosis Date  . Acquired underactive thyroid   . Anxiety   . Arthritis   . Cancer ([place])    skin   Arms and chest  . Complication of anesthesia    dry  heaves  . COPD (chronic obstructive pulmonary disease) (HCC)    mild  . Depression   . Dysrhythmia    tachyarrythmias  . Headache   . Hypertension   . MRSA (methicillin resistant Staphylococcus aureus) colonization   . PONV (postoperative nausea and vomiting)   . Psoriasis   . Sleep apnea    cpap     Transfusion: None.   Consultants (if any):   Discharged Condition: Improved  Hospital Course: Victoria Flynn is an 73 y.o. female who was admitted 09/20/2018 with a diagnosis of degenerative arthrosis of the left knee and went to the operating room on 09/20/2018 and underwent the above named procedures.    Surgeries: Procedure(s): COMPUTER ASSISTED TOTAL KNEE ARTHROPLASTY - RNFA on 09/20/2018 Patient tolerated the surgery well. Taken to PACU where she was stabilized and then transferred to the orthopedic floor.  Started on Lovenox 30mg  q 12 hrs.  Foot pumps applied bilaterally at 80 mm. Heels elevated on bed with rolled towels. No evidence of DVT. Negative [place]. Physical therapy started on day #1 for gait training and transfer. OT started day #1 for ADL and assisted devices.  Patient's IV and Hemovac were removed on POD2.  Foley was removed on POD1.  Implants: DePuy PFC Sigma size 3 posterior stabilized femoral component (cemented), size 3 MBT tibial component (cemented), 35 mm 3 peg oval dome patella (cemented), and a 10 mm stabilized rotating platform polyethylene insert.  She was given perioperative antibiotics:  Anti-infectives (From admission, onward)   Start     Dose/Rate Route Frequency Ordered Stop   09/20/18 1500  ceFAZolin (ANCEF) IVPB 2g/100 mL premix     2 g 200 mL/hr over 30 Minutes Intravenous Every 6 hours 09/20/18 1400 09/21/18 1028   09/20/18 0606  ceFAZolin (ANCEF) 2-4 GM/100ML-% IVPB    Note to Pharmacy: Victoria Flynn   : cabinet override      09/20/18 0606 09/20/18 0754   09/20/18 0600  ceFAZolin (ANCEF) IVPB 2g/100 mL premix     2 g 200 mL/hr over 30 Minutes Intravenous On call to O.R. 09/19/18 2228 09/20/18 0809    .  She was given sequential compression devices, early ambulation, and Lovenox for DVT prophylaxis.  She benefited maximally from the hospital stay and there were no complications.    Recent vital signs:  Vitals:   09/21/18 2146 09/21/18 2330  BP: (!) 178/79 (!) 183/79  Pulse: 62 (!) 58  Resp:  18  Temp:  98.3 F (36.8 C)  SpO2: 94% 93%    Recent laboratory studies:  Lab Results  Component Value Date   HGB 12.5 09/13/2018   HGB 12.1 10/16/2017   HGB 10.6 (L) 06/16/2011   Lab Results  Component Value Date   WBC 10.3 09/13/2018   PLT 283 09/13/2018   Lab Results  Component Value Date   INR 1.0 09/13/2018   Lab Results  Component Value Date   NA 139 09/13/2018   K 3.2 (L) 09/13/2018   CL 103 09/13/2018   CO2 26 09/13/2018   BUN 21 09/13/2018   CREATININE 0.65  09/13/2018   GLUCOSE 85 09/13/2018   Discharge Medications:   Allergies as of 09/22/2018      Reactions   Morphine And Related Nausea And Vomiting      Medication List    STOP taking these medications   aspirin EC 81 MG tablet   ibuprofen 200 MG tablet Commonly known as: ADVIL     TAKE these medications   acetaminophen 650 MG CR tablet Commonly known as: TYLENOL Take 1,300 mg by mouth 2 (two) times a day.   amLODipine 5 MG tablet Commonly known as: NORVASC Take 5 mg by mouth daily.   BC HEADACHE POWDER PO Take 1 packet by mouth daily as needed (headache).   calcium-vitamin D 500-200 MG-UNIT tablet Commonly known as: OSCAL WITH D Take 1 tablet by mouth 2 (two) times daily.   carvedilol 3.125 MG tablet Commonly known as: COREG Take 3.125 mg by mouth 2 (two) times daily.   citalopram 10 MG tablet Commonly known as: CELEXA Take 10 mg by mouth every morning.   clobetasol ointment 0.05 % Commonly known as: TEMOVATE Apply 1 application topically 2 (two) times a week.   donepezil 5 MG tablet Commonly known as: ARICEPT Take 5 mg by mouth daily with breakfast.   enoxaparin 40 MG/0.4ML injection Commonly known as: LOVENOX Inject 0.4 mLs (40 mg total) into the skin daily.   FISH OIL PO Take 950 mg by mouth daily.   hydrochlorothiazide 12.5 MG capsule Commonly known as: MICROZIDE Take 12.5 mg by mouth daily.   levothyroxine 137 MCG tablet Commonly known as: SYNTHROID Take 137 mcg by mouth every morning.   LORazepam 0.5 MG tablet Commonly known as: ATIVAN Take 1 mg by mouth at bedtime. For anxiety.   losartan 50 MG tablet Commonly known as: COZAAR Take 50 mg by mouth daily.   MELATONIN MAXIMUM STRENGTH PO Take 5-15 mg by mouth at bedtime.   Otezla 30 MG Tabs Generic drug: Apremilast Take 30 mg by mouth daily.   oxyCODONE 5 MG immediate release tablet Commonly known as: Oxy IR/ROXICODONE Take 1-2 tablets (5-10 mg total) by mouth every 4 (four) hours as  needed for moderate pain.   potassium chloride 10 MEQ tablet Commonly known as: K-DUR Take 1 tablet by mouth 2 (two) times a day.   traMADol 50 MG tablet Commonly known as: ULTRAM Take 1 tablet (50 mg total) by mouth every 6 (six) hours as needed for moderate pain.   traZODone 50 MG tablet Commonly known as: DESYREL Take 50 mg by mouth daily.            Durable Medical Equipment  (From admission, onward)         Start     Ordered   09/20/18 1401  DME Walker rolling  Once    Question:  Patient needs a  walker to treat with the following condition  Answer:  Total knee replacement status   09/20/18 1400   09/20/18 1401  DME Bedside commode  Once    Question:  Patient needs a bedside commode to treat with the following condition  Answer:  Total knee replacement status   09/20/18 1400         Diagnostic Studies: Dg Knee Left Port  Result Date: 09/20/2018 CLINICAL DATA:  Left knee replacement EXAM: PORTABLE LEFT KNEE - 1-2 VIEW COMPARISON:  None. FINDINGS: The left knee demonstrates a total knee arthroplasty without evidence of hardware failure complication. There is no significant joint effusion. There is no fracture or dislocation. The alignment is anatomic. Surgical drains are present. Post-surgical changes noted in the surrounding soft tissues. IMPRESSION: Interval left total knee arthroplasty. Electronically Signed   By: Kathreen Devoid   On: 09/20/2018 10:59   Disposition: Discharge home with HHPT on 09/22/18 following stair training.  Follow-up Information    Watt Climes, PA On 10/02/2018.   Specialty: Physician Assistant Why: at 10:45am Contact information: [place] [address] [phone]        Dereck Leep, MD On 10/31/2018.   Specialty: Orthopedic Surgery Why: at 1:45pm Contact information: [place] [address] [phone]          Signed: Judson Roch PA-C 09/22/2018, 8:31 AM

## 2018-09-22 NOTE — Progress Notes (Signed)
Discharge instructions provided to Pt. All areas communicated to pt. She verbalizes understanding. Offers no question or concerns. TED hose on both legs. [place], clean,dry,intact. Understands instructions for Lovenox S.Q. injection. Pt wheeled to car, family providing transportation.

## 2018-09-22 NOTE — Progress Notes (Signed)
Physical Therapy Treatment Patient Details Name: Victoria Flynn MRN: [id number] DOB: [birth year] Today's Date: 09/22/2018    History of Present Illness Patient is 73 yo female s/p  L TKA, WBAT. PMH of bilateral THA and R TKA, COPD, HTN.    PT Comments    Pt is making good progress and ready for dc this afternoon. Pt able to perform stair training with safe technique. Reports she will have assistance from friends to enter home. All mobility performed on room air with sats WNL ranging from 92-94% with exertion. L knee PROM at 75 degrees flexion, limited by pain. Pt eager to dc home this date, limited gait distance due to fatigue. RN notified.   Follow Up Recommendations  Home health PT     Equipment Recommendations  None recommended by PT;Other (comment)    Recommendations for Other Services       Precautions / Restrictions Precautions Precautions: Knee Precaution Booklet Issued: Yes (comment) Restrictions Weight Bearing Restrictions: Yes LLE Weight Bearing: Weight bearing as tolerated    Mobility  Bed Mobility Overal bed mobility: Needs Assistance Bed Mobility: Supine to Sit     Supine to sit: Min assist     General bed mobility comments: needs assist for scooting out towards EOB.  Transfers Overall transfer level: Needs assistance Equipment used: Rolling walker (2 wheeled) Transfers: Sit to/from Stand Sit to Stand: Min assist;Min guard         General transfer comment: needs cues to push from seated surface every time during standing attempt. Once standing, uses RW with safe technique  Ambulation/Gait Ambulation/Gait assistance: Min guard Gait Distance (Feet): 30 Feet Assistive device: Rolling walker (2 wheeled) Gait Pattern/deviations: Step-through pattern Gait velocity: decreased   General Gait Details: ambulated in room distances to [facility] secondary for energy conservation prior to standing training.   Stairs Stairs: Yes Stairs assistance: Min guard Stair  Management: One rail Right;Step to pattern Number of Stairs: 4 General stair comments: Pt able to navigate stairs with demonstration given prior to performance. Has increased difficulty with going down secondary to fear. Only requires CGA.    Wheelchair Mobility    Modified Rankin (Stroke Patients Only)       Balance Overall balance assessment: Needs assistance Sitting-balance support: Feet supported Sitting balance-Leahy Scale: Good     Standing balance support: Bilateral upper extremity supported Standing balance-Leahy Scale: Fair                              Cognition Arousal/Alertness: Awake/alert Behavior During Therapy: WFL for tasks assessed/performed Overall Cognitive Status: Within Functional Limits for tasks assessed                                        Exercises Total Joint Exercises Ankle Circles/Pumps: AROM;Both;10 reps Quad Sets: AROM;Left;10 reps Gluteal Sets: AROM;Both;10 reps Heel Slides: AAROM;Left;10 reps Hip ABduction/ADduction: AAROM;Left;10 reps Straight Leg Raises: AROM;10 reps;AAROM;Left Long Arc Quad: AROM;Left;10 reps Knee Flexion: AAROM;Left;5 reps Goniometric ROM: 5-70 limited by pain Other Exercises Other Exercises: to commode after gait. Pt needs min assist for set up. Instructed to call for RN aide prior to standing in order to perform dressing for dc    General Comments        Pertinent Vitals/Pain Pain Assessment: Faces Faces Pain Scale: Hurts little more Pain Location: L knee Pain Descriptors /  Indicators: Operative site guarding Pain Intervention(s): Limited activity within patient's tolerance;Repositioned;Ice applied    Home Living                      Prior Function            PT Goals (current goals can now be found in the care plan section) Acute Rehab PT Goals Patient Stated Goal: to go home PT Goal Formulation: With patient Time For Goal Achievement: 10/04/18 Potential to  Achieve Goals: Good Progress towards PT goals: Progressing toward goals    Frequency    BID      PT Plan Current plan remains appropriate    Co-evaluation              AM-PAC PT "6 Clicks" Mobility   Outcome Measure  Help needed turning from your back to your side while in a flat bed without using bedrails?: A Little Help needed moving from lying on your back to sitting on the side of a flat bed without using bedrails?: A Little Help needed moving to and from a bed to a chair (including a wheelchair)?: A Little Help needed standing up from a chair using your arms (e.g., wheelchair or bedside chair)?: A Little Help needed to walk in hospital room?: A Little Help needed climbing 3-5 steps with a railing? : A Little 6 Click Score: 18    End of Session Equipment Utilized During Treatment: Gait belt Activity Tolerance: Patient tolerated treatment well Patient left: (left on BSC, aide aware) Nurse Communication: Mobility status PT Visit Diagnosis: Other abnormalities of gait and mobility (R26.89);Difficulty in walking, not elsewhere classified (R26.2);Muscle weakness (generalized) (M62.81);Pain Pain - Right/Left: Left Pain - part of body: Knee     Time: 6761-9509 PT Time Calculation (min) (ACUTE ONLY): 25 min  Charges:  $Gait Training: 23-37 mins $Therapeutic Exercise: 8-22 mins $Therapeutic Activity: 8-22 mins                     Greggory Stallion, PT, DPT [phone]    Damione Robideau 09/22/2018, 2:34 PM

## 2018-09-22 NOTE — Plan of Care (Signed)
  Problem: Education: Goal: Knowledge of General Education information will improve Description: Including pain rating scale, medication(s)/side effects and non-pharmacologic comfort measures 09/22/2018 0401 by Cheron Every, RN Outcome: Progressing 09/22/2018 0400 by Cheron Every, RN Outcome: Progressing   Problem: Health Behavior/Discharge Planning: Goal: Ability to manage health-related needs will improve 09/22/2018 0401 by Cheron Every, RN Outcome: Progressing 09/22/2018 0400 by Cheron Every, RN Outcome: Progressing   Problem: Clinical Measurements: Goal: Ability to maintain clinical measurements within normal limits will improve 09/22/2018 0401 by Cheron Every, RN Outcome: Progressing 09/22/2018 0400 by Cheron Every, RN Outcome: Progressing Goal: Will remain free from infection 09/22/2018 0401 by Cheron Every, RN Outcome: Progressing 09/22/2018 0400 by Cheron Every, RN Outcome: Progressing Goal: Diagnostic test results will improve 09/22/2018 0401 by Cheron Every, RN Outcome: Progressing 09/22/2018 0400 by Cheron Every, RN Outcome: Progressing Goal: Respiratory complications will improve 09/22/2018 0401 by Cheron Every, RN Outcome: Progressing 09/22/2018 0400 by Cheron Every, RN Outcome: Progressing Goal: Cardiovascular complication will be avoided 09/22/2018 0401 by Cheron Every, RN Outcome: Progressing 09/22/2018 0400 by Cheron Every, RN Outcome: Progressing   Problem: Activity: Goal: Risk for activity intolerance will decrease 09/22/2018 0401 by Cheron Every, RN Outcome: Progressing 09/22/2018 0400 by Cheron Every, RN Outcome: Progressing   Problem: Nutrition: Goal: Adequate nutrition will be maintained 09/22/2018 0401 by Cheron Every, RN Outcome: Progressing 09/22/2018 0400 by Cheron Every, RN Outcome: Progressing   Problem: Coping: Goal: Level of anxiety will  decrease 09/22/2018 0401 by Cheron Every, RN Outcome: Progressing 09/22/2018 0400 by Cheron Every, RN Outcome: Progressing   Problem: Elimination: Goal: Will not experience complications related to bowel motility 09/22/2018 0401 by Cheron Every, RN Outcome: Progressing 09/22/2018 0400 by Cheron Every, RN Outcome: Progressing Goal: Will not experience complications related to urinary retention 09/22/2018 0401 by Cheron Every, RN Outcome: Progressing 09/22/2018 0400 by Cheron Every, RN Outcome: Progressing   Problem: Pain Managment: Goal: General experience of comfort will improve 09/22/2018 0401 by Cheron Every, RN Outcome: Progressing 09/22/2018 0400 by Cheron Every, RN Outcome: Progressing   Problem: Safety: Goal: Ability to remain free from injury will improve 09/22/2018 0401 by Cheron Every, RN Outcome: Progressing 09/22/2018 0400 by Cheron Every, RN Outcome: Progressing   Problem: Skin Integrity: Goal: Risk for impaired skin integrity will decrease 09/22/2018 0401 by Cheron Every, RN Outcome: Progressing 09/22/2018 0400 by Cheron Every, RN Outcome: Progressing

## 2018-09-22 NOTE — Progress Notes (Signed)
PT Cancellation Note  Patient Details Name: Victoria Flynn MRN: [id number] DOB: 1945/09/04   Cancelled Treatment:    Reason Eval/Treat Not Completed: Fatigue/lethargy limiting ability to participate   Returned prior to lunch to complete stair training.  Pt sleeping soundly on C-pap.  Will try again this pm.  Pt will be handed off to another therapist this PM and barriers encountered this am were discussed.   Chesley Noon 09/22/2018, 1:15 PM

## 2018-09-22 NOTE — Progress Notes (Signed)
Physical Therapy Treatment Patient Details Name: Victoria Flynn MRN: [id number] DOB: August 04, 1945 Today's Date: 09/22/2018    History of Present Illness Patient is 73 yo female s/p  L TKA, WBAT. PMH of bilateral THA and R TKA, COPD, HTN.    PT Comments    Pt generally sleepy on arrival this am.  Awoke easily stated she was tired and had pain medication about an hour prior.  Pt on 2 lpm.  When questioned stated she was "a bit low this morning" and Bill, RN pt her back on it.  94% at rest.  Removed O2 and sats monitored with activity.  Participated in exercises as described below. To edge of bed with min a x 1.  Some increased pain and soreness this am.  Stretching EOB and while bulky post-op dressing was removed, ROM did not increase due to overall discomfort and guarding.  She is able to stand and ambulate 140' in hallway with walker and min guard/assist with verbal cues for walker placement ad step pattern to increase safety.  While pt was able to awaken initially for gait and was laughing and joking with Dr Marry Guan in hallway, she began to slow her gait speed and seemed generally fatigued upon approach to rehab gym aroudn 140'.  She stopped and stood in hallway and when questioned stated she was "tired."  Instructed to sit.  Sats 91% on room air.  She was taken to rehab gym and given an extended rest time but then stated she needed to have a BM and was taken back to her room.  Transferred to commode.  She was unable to tolerate further activity at this time and requested to go back to bed to rest.  Ambulated 10' back to bed with walker and min guard with min assist to return to supine.  While sats remained in low 90's for activity, O2 was replaced at 2 lpm and RN notified of general lethargy and progress during session.    Pt will need to complete stair training this pm prior to discharge.  Anticipate pt will do well but lethargy may affect mobility and discharge this pm.     Follow Up Recommendations  Home health PT     Equipment Recommendations  None recommended by PT;Other (comment)    Recommendations for Other Services       Precautions / Restrictions Precautions Precautions: Knee Restrictions Weight Bearing Restrictions: Yes LLE Weight Bearing: Weight bearing as tolerated    Mobility  Bed Mobility Overal bed mobility: Needs Assistance Bed Mobility: Supine to Sit     Supine to sit: Min assist        Transfers Overall transfer level: Needs assistance Equipment used: Rolling walker (2 wheeled) Transfers: Sit to/from Stand Sit to Stand: Min assist;Min guard            Ambulation/Gait Ambulation/Gait assistance: Min guard;Min Web designer (Feet): 140 Feet Assistive device: Rolling walker (2 wheeled) Gait Pattern/deviations: Step-through pattern Gait velocity: decreased   General Gait Details: verbal cues for step pattern and walker position   Stairs             Wheelchair Mobility    Modified Rankin (Stroke Patients Only)       Balance Overall balance assessment: Needs assistance Sitting-balance support: Feet supported Sitting balance-Leahy Scale: Fair     Standing balance support: Bilateral upper extremity supported Standing balance-Leahy Scale: Fair  Cognition Arousal/Alertness: Awake/alert Behavior During Therapy: WFL for tasks assessed/performed Overall Cognitive Status: Within Functional Limits for tasks assessed                                        Exercises Total Joint Exercises Ankle Circles/Pumps: AROM;Both;10 reps Quad Sets: AROM;Left;10 reps Gluteal Sets: AROM;Both;10 reps Heel Slides: AAROM;Left;10 reps Hip ABduction/ADduction: AAROM;Left;10 reps Straight Leg Raises: AROM;10 reps;AAROM;Left Long Arc Quad: AROM;Left;10 reps Knee Flexion: AAROM;Left;5 reps Goniometric ROM: 5-70 limited by pain Other Exercises Other Exercises: to commode after gait  to void and BM    General Comments        Pertinent Vitals/Pain Pain Assessment: Faces Faces Pain Scale: Hurts a little bit Pain Descriptors / Indicators: Sore Pain Intervention(s): Limited activity within patient's tolerance;Monitored during session;Premedicated before session    Home Living                      Prior Function            PT Goals (current goals can now be found in the care plan section) Progress towards PT goals: Progressing toward goals    Frequency    BID      PT Plan Current plan remains appropriate    Co-evaluation              AM-PAC PT "6 Clicks" Mobility   Outcome Measure  Help needed turning from your back to your side while in a flat bed without using bedrails?: A Little Help needed moving from lying on your back to sitting on the side of a flat bed without using bedrails?: A Little Help needed moving to and from a bed to a chair (including a wheelchair)?: A Little Help needed standing up from a chair using your arms (e.g., wheelchair or bedside chair)?: A Little Help needed to walk in hospital room?: A Little Help needed climbing 3-5 steps with a railing? : A Little 6 Click Score: 18    End of Session Equipment Utilized During Treatment: Gait belt Activity Tolerance: Patient limited by fatigue Patient left: in bed;with call bell/phone within reach;with bed alarm set Nurse Communication: Mobility status;Other (comment) Pain - Right/Left: Left Pain - part of body: Knee     Time: 0762-2633 PT Time Calculation (min) (ACUTE ONLY): 41 min  Charges:  $Gait Training: 8-22 mins $Therapeutic Exercise: 8-22 mins $Therapeutic Activity: 8-22 mins                    Chesley Noon, PTA 09/22/18, 1:15 PM

## 2018-09-22 NOTE — Progress Notes (Signed)
  Subjective: 2 Days Post-Op Procedure(s) (LRB): COMPUTER ASSISTED TOTAL KNEE ARTHROPLASTY - RNFA (Left) Patient reports pain as mild.   Patient is well, and has had no acute complaints or problems Plan is to go Home after hospital stay. Negative for chest pain and shortness of breath Fever: no Gastrointestinal:Negative for nausea and vomiting Patient has had a BM, using Bone Foam this morning.  Objective: Vital signs in last 24 hours: Temp:  [98.3 F (36.8 C)] 98.3 F (36.8 C) (08/01 2330) Pulse Rate:  [58-62] 58 (08/01 2330) Resp:  [18] 18 (08/01 2330) BP: (154-183)/(69-81) 183/79 (08/01 2330) SpO2:  [93 %-94 %] 93 % (08/01 2330)  Intake/Output from previous day:  Intake/Output Summary (Last 24 hours) at 09/22/2018 0827 Last data filed at 09/21/2018 0925 Gross per 24 hour  Intake 360 ml  Output -  Net 360 ml    Intake/Output this shift: No intake/output data recorded.  Labs: No results for input(s): HGB in the last 72 hours. No results for input(s): WBC, RBC, HCT, PLT in the last 72 hours. No results for input(s): NA, K, CL, CO2, BUN, CREATININE, GLUCOSE, CALCIUM in the last 72 hours. No results for input(s): LABPT, INR in the last 72 hours.   EXAM General - Patient is Alert, Appropriate and Oriented Extremity - ABD soft Neurovascular intact Sensation intact distally Intact pulses distally Dorsiflexion/Plantar flexion intact Incision: dressing C/D/I No cellulitis present  Negative Homan's to the left leg. Dressing/Incision - Bulky dressing removed this AM, Hemovac removed and 4x4 with tegaderm applied to the left knee. Motor Function - intact, moving foot and toes well on exam.   Past Medical History:  Diagnosis Date  . Acquired underactive thyroid   . Anxiety   . Arthritis   . Cancer ([place])    skin   Arms and chest  . Complication of anesthesia    dry  heaves  . COPD (chronic obstructive pulmonary disease) (H[facility])    mild  . Depression   . Dysrhythmia    tachyarrythmias  . Headache   . Hypertension   . MRSA (methicillin resistant Staphylococcus aureus) colonization   . PONV (postoperative nausea and vomiting)   . Psoriasis   . Sleep apnea    cpap    Assessment/Plan: 2 Days Post-Op Procedure(s) (LRB): COMPUTER ASSISTED TOTAL KNEE ARTHROPLASTY - RNFA (Left) Active Problems:   Total knee replacement status  Estimated body mass index is 35.88 kg/m as calculated from the following:   Height as of this encounter: 5' 1.5" (1.562 m).   Weight as of this encounter: 87.5 kg. Up with therapy  Pt has completed all steps with therapy except for stair training. Pt has had a BM.  Normal BS this morning. Bulky dressing and hemovac removed this morning. Discharge home this afternoon with HHPT.  DVT Prophylaxis - Lovenox, Foot Pumps and TED hose Weight-Bearing as tolerated to left leg  J. Cameron Proud, PA-C [facility] 09/22/2018, 8:27 AM

## 2018-09-22 NOTE — TOC Transition Note (Signed)
Transition of Care [facility]) - CM/SW Discharge Note   Patient Details  Name: Victoria Flynn MRN: [id number] Date of Birth: [birth year]  [facility] CM/SW Contact:  Latanya Maudlin, RN Phone Number: 09/22/2018, 9:17 AM   Clinical Narrative: Patient to be discharged per MD order. Orders in place for home health services. Home health was set up yesterday via [facility]. Notified Helene Kelp at [facility] of discharge. Patient has rolling walker and bedside commode in the home. Family to transport .      Final next level of care: [place] Barriers to Discharge: No Barriers Identified   Patient Goals and CMS Choice Patient states their goals for this hospitalization and ongoing recovery are:: to go home CMS Medicare.gov Compare Post Acute Care list provided to:: Patient Choice offered to / list presented to : Patient  Discharge Placement                       Discharge Plan and Services   Discharge Planning Services: CM Consult Post Acute Care Choice: Home Health                    HH Arranged: PT [place]: [facility] Date [place]: 09/22/18 Time [place]: [phone] Representative spoke with at [place]: [place] Interventions     Readmission Risk Interventions Readmission Risk Prevention Plan 09/22/2018 09/21/2018  Post Dischage Appt Complete Complete  Medication Screening Complete Complete  Transportation Screening Complete Complete  Some recent data might be hidden

## 2018-11-27 ENCOUNTER — Other Ambulatory Visit: Payer: Self-pay | Admitting: Internal Medicine

## 2018-11-27 DIAGNOSIS — Z1231 Encounter for screening mammogram for malignant neoplasm of breast: Secondary | ICD-10-CM

## 2019-01-06 ENCOUNTER — Ambulatory Visit
Admission: RE | Admit: 2019-01-06 | Discharge: 2019-01-06 | Disposition: A | Discharge status: Home / Self Care | Payer: Medicare Other | Source: Ambulatory Visit | Attending: Internal Medicine | Admitting: Internal Medicine

## 2019-01-06 DIAGNOSIS — Z1231 Encounter for screening mammogram for malignant neoplasm of breast: Secondary | ICD-10-CM | POA: Insufficient documentation

## 2019-06-09 ENCOUNTER — Ambulatory Visit: Payer: Medicare PPO | Admitting: Dermatology

## 2019-06-09 ENCOUNTER — Other Ambulatory Visit: Payer: Self-pay

## 2019-06-09 DIAGNOSIS — L821 Other seborrheic keratosis: Secondary | ICD-10-CM | POA: Diagnosis not present

## 2019-06-09 DIAGNOSIS — L409 Psoriasis, unspecified: Secondary | ICD-10-CM

## 2019-06-09 DIAGNOSIS — L570 Actinic keratosis: Secondary | ICD-10-CM | POA: Diagnosis not present

## 2019-06-09 DIAGNOSIS — L820 Inflamed seborrheic keratosis: Secondary | ICD-10-CM | POA: Diagnosis not present

## 2019-06-09 DIAGNOSIS — L578 Other skin changes due to chronic exposure to nonionizing radiation: Secondary | ICD-10-CM | POA: Diagnosis not present

## 2019-06-09 MED ORDER — TRIAMCINOLONE ACETONIDE 0.1 % EX CREA: TOPICAL_CREAM | CUTANEOUS | 5 refills | Status: DC

## 2019-06-09 MED ORDER — OTEZLA 30 MG PO TABS: 30.0000 mg | ORAL_TABLET | Freq: Two times a day (BID) | ORAL | 5 refills | Status: DC

## 2019-06-09 NOTE — Progress Notes (Addendum)
Follow-Up Visit   Subjective  Victoria Flynn is a 74 y.o. female who presents for the following: Psoriasis (patient was on Enbrel in the past but had recurrent upper respiratory infections and MRSA so she D/C'ed medication. Patient tolerating [place] Nail OK (she had headaches before starting medication). No other side effects per patient no depression-no GI symptoms.she continues to flare on her elbows. Patient also using TMC 0.1% ointment, but doesn't use it on her elbows because she doesn't like the oily texture.  ). She has other spots she would like checked.  The following portions of the chart were reviewed this encounter and updated as appropriate: Tobacco  Allergies  Meds  Problems  Med Hx  Surg Hx  Fam Hx     Review of Systems: No other skin or systemic complaints.  Objective  Well appearing patient in no apparent distress; mood and affect are within normal limits.  A focused examination was performed including the b/l elbows. Relevant physical exam findings are noted in the Assessment and Plan. Objective  R nose tip x 1: Erythematous thin papules/macules with gritty scale.   Objective  Forehead x 2, L temple x 2 (4): Erythematous keratotic or waxy stuck-on papule or plaque.   Objective  B/L elbows, legs, and trunk: Moderately thick plaques of the elbows about 5.0 cm, periumbilical area about 2.0 cm. Knees clear today. Guttate plaques on the legs.   Assessment & Plan  AK (actinic keratosis) R nose tip x 1  Destruction of lesion - R nose tip x 1 Complexity: simple   Destruction method: cryotherapy   Informed consent: discussed and consent obtained   Timeout:  patient name, date of birth, surgical site, and procedure verified Lesion destroyed using liquid nitrogen: Yes   Region frozen until ice ball extended beyond lesion: Yes   Outcome: patient tolerated procedure well with no complications   Post-procedure details: wound care instructions given    Inflamed  seborrheic keratosis (4) Forehead x 2, L temple x 2  Destruction of lesion - Forehead x 2, L temple x 2 Complexity: simple   Destruction method: cryotherapy   Informed consent: discussed and consent obtained   Timeout:  patient name, date of birth, surgical site, and procedure verified Lesion destroyed using liquid nitrogen: Yes   Region frozen until ice ball extended beyond lesion: Yes   Outcome: patient tolerated procedure well with no complications   Post-procedure details: wound care instructions given    Psoriasis severe on systemic medication without significant side effects doing well. B/L elbows, legs, and trunk  Patient has tried Enbrel in the past - no diarrhea, depression, patient does c/o h/a but had those before starting [place]. Continue Otezla 30mg  po BID, will switch TMC 0.1% ointment to cream since the patient doesn't like the texture of the ointment. Continue TMC 0.1% BID PRN. Avoid f/g/a/.   triamcinolone cream (KENALOG) 0.1 % - B/L elbows, legs, and trunk  Apremilast (OTEZLA) 30 MG TABS - B/L elbows, legs, and trunk Seborrheic Keratoses - Stuck-on, waxy, tan-brown papules and plaques  - Discussed benign etiology and prognosis. - Observe - Call for any changes  Actinic Damage - diffuse scaly erythematous macules with underlying dyspigmentation - Recommend daily broad spectrum sunscreen SPF 30+ to sun-exposed areas, reapply every 2 hours as needed.  - Call for new or changing lesions.   Return in about 6 months (around 12/09/2019) for psoriasis.   Luther Redo, CMA, am acting as scribe for Sarina Ser, MD .  Documentation: I have reviewed the above documentation for accuracy and completeness, and I agree with the above.  Sarina Ser, MD

## 2019-06-10 ENCOUNTER — Encounter: Payer: Self-pay | Admitting: Dermatology

## 2019-06-24 ENCOUNTER — Other Ambulatory Visit: Payer: Self-pay

## 2019-06-24 DIAGNOSIS — L400 Psoriasis vulgaris: Secondary | ICD-10-CM

## 2019-06-24 MED ORDER — OTEZLA 30 MG PO TABS: 30.0000 mg | ORAL_TABLET | Freq: Two times a day (BID) | ORAL | 5 refills | Status: DC

## 2019-06-24 NOTE — Telephone Encounter (Signed)
Otezla prescription sent to the wrong pharmacy.

## 2019-09-15 ENCOUNTER — Ambulatory Visit
Admission: RE | Admit: 2019-09-15 | Discharge: 2019-09-15 | Disposition: A | Discharge status: Home / Self Care | Payer: Medicare PPO | Source: Ambulatory Visit | Attending: Family Medicine | Admitting: Family Medicine

## 2019-09-15 ENCOUNTER — Other Ambulatory Visit: Payer: Self-pay

## 2019-09-15 ENCOUNTER — Other Ambulatory Visit: Payer: Self-pay | Admitting: Family Medicine

## 2019-09-15 DIAGNOSIS — I6782 Cerebral ischemia: Secondary | ICD-10-CM | POA: Insufficient documentation

## 2019-09-15 DIAGNOSIS — S0990XA Unspecified injury of head, initial encounter: Secondary | ICD-10-CM

## 2019-09-15 DIAGNOSIS — W19XXXA Unspecified fall, initial encounter: Secondary | ICD-10-CM | POA: Insufficient documentation

## 2019-09-15 DIAGNOSIS — Y939 Activity, unspecified: Secondary | ICD-10-CM | POA: Diagnosis not present

## 2019-09-15 DIAGNOSIS — Y929 Unspecified place or not applicable: Secondary | ICD-10-CM | POA: Insufficient documentation

## 2019-12-10 ENCOUNTER — Encounter: Payer: Self-pay | Admitting: Dermatology

## 2019-12-10 ENCOUNTER — Telehealth: Payer: Self-pay

## 2019-12-10 ENCOUNTER — Ambulatory Visit: Payer: Medicare PPO | Admitting: Dermatology

## 2019-12-10 ENCOUNTER — Other Ambulatory Visit: Payer: Self-pay

## 2019-12-10 DIAGNOSIS — L409 Psoriasis, unspecified: Secondary | ICD-10-CM

## 2019-12-10 DIAGNOSIS — L578 Other skin changes due to chronic exposure to nonionizing radiation: Secondary | ICD-10-CM | POA: Diagnosis not present

## 2019-12-10 DIAGNOSIS — Z85828 Personal history of other malignant neoplasm of skin: Secondary | ICD-10-CM | POA: Diagnosis not present

## 2019-12-10 MED ORDER — OTEZLA 30 MG PO TABS: 30.0000 mg | ORAL_TABLET | Freq: Two times a day (BID) | ORAL | 7 refills | Status: DC

## 2019-12-10 MED ORDER — CALCIPOTRIENE 0.005 % EX CREA: TOPICAL_CREAM | Freq: Every day | CUTANEOUS | 5 refills | Status: DC

## 2019-12-10 NOTE — Telephone Encounter (Signed)
[employer] has denied Calcipotriene Cream. Patient must try and fail betamethasone.

## 2019-12-10 NOTE — Telephone Encounter (Signed)
Send betamethasone

## 2019-12-10 NOTE — Progress Notes (Signed)
   Follow-Up Visit   Subjective  Victoria Flynn is a 74 y.o. female who presents for the following: Psoriasis (elbows, umbilicus Otezla 30mg  1 po bid with no side effects other then headaches, TMC 0.1% cr qd/bid). Also with history of skin cancer.  The following portions of the chart were reviewed this encounter and updated as appropriate:  Tobacco  Allergies  Meds  Problems  Med Hx  Surg Hx  Fam Hx     Review of Systems:  No other skin or systemic complaints except as noted in HPI or Assessment and Plan.  Objective  Well appearing patient in no apparent distress; mood and affect are within normal limits.  A focused examination was performed including elbows. Relevant physical exam findings are noted in the Assessment and Plan.  Objective  bil elbows, ubilicus: Well-demarcated erythematous papules/plaques with silvery scale, guttate pink scaly papules.   Images       Assessment & Plan    Actinic Damage - diffuse scaly erythematous macules with underlying dyspigmentation - Recommend daily broad spectrum sunscreen SPF 30+ to sun-exposed areas, reapply every 2 hours as needed.  - Call for new or changing lesions.  Hx of Skin Cancer - Anterior neck clear today.  Psoriasis - severe; on oral systemic Otezla bil elbows, ubilicus Severe on oral systemic medication without significant side effects (some headache, but she has sinus headaches anyway and not worse than before on Otezla)  doing well, persistent and not to goal. Patient has tried Enbrel in the past. No diarrhea or depression.  Pt does c/o headaches but had those before starting [employer] 30mg  1 po bid Cont TMC 0.1% cr qd/bid up to 5d/wk prn flares, avoid f/g/a/ Start Calcipotriene cr qd to aa psoriasis  Discussed restarting Xtrac, will get PA, pt defers until after the holidays.  Topical steroids (such as triamcinolone, fluocinolone, fluocinonide, mometasone, clobetasol, halobetasol, betamethasone,  hydrocortisone) can cause thinning and lightening of the skin if they are used for too long in the same area. Your physician has selected the right strength medicine for your problem and area affected on the body. Please use your medication only as directed by your physician to prevent side effects.   Side effects of Otezla (apremilast) include diarrhea, nausea, headache, upper respiratory infection, depression, and weight decrease (5-10%). It should only be taken by pregnant women after a discussion regarding risks and benefits with their doctor. Goal is control of skin condition, not cure.  The use of [place] Nail requires long term medication management, including periodic office visits.   calcipotriene (DOVONOX) 0.005 % cream - bil elbows, ubilicus  Apremilast (OTEZLA) 30 MG TABS - bil elbows, ubilicus  Other Related Medications triamcinolone cream (KENALOG) 0.1 % Apremilast (OTEZLA) 30 MG TABS  Return in about 6 months (around 06/09/2020) for psoriasis.  I, Othelia Pulling, RMA, am acting as scribe for Sarina Ser, MD .  Documentation: I have reviewed the above documentation for accuracy and completeness, and I agree with the above.  Sarina Ser, MD

## 2019-12-11 ENCOUNTER — Other Ambulatory Visit: Payer: Self-pay

## 2019-12-11 MED ORDER — BETAMETHASONE DIPROPIONATE 0.05 % EX CREA: TOPICAL_CREAM | Freq: Two times a day (BID) | CUTANEOUS | 0 refills | Status: DC | PRN

## 2019-12-11 NOTE — Progress Notes (Signed)
Pt requested different pharmacy  ° °

## 2019-12-11 NOTE — Telephone Encounter (Signed)
RX sent in and left message for patient to return our call.

## 2019-12-18 ENCOUNTER — Other Ambulatory Visit: Payer: Self-pay | Admitting: Internal Medicine

## 2019-12-18 DIAGNOSIS — Z1231 Encounter for screening mammogram for malignant neoplasm of breast: Secondary | ICD-10-CM

## 2020-01-27 ENCOUNTER — Ambulatory Visit
Admission: RE | Admit: 2020-01-27 | Discharge: 2020-01-27 | Disposition: A | Discharge status: Home / Self Care | Payer: Medicare PPO | Source: Ambulatory Visit | Attending: Internal Medicine | Admitting: Internal Medicine

## 2020-01-27 ENCOUNTER — Other Ambulatory Visit: Payer: Self-pay

## 2020-01-27 DIAGNOSIS — Z1231 Encounter for screening mammogram for malignant neoplasm of breast: Secondary | ICD-10-CM | POA: Diagnosis not present

## 2020-03-01 ENCOUNTER — Other Ambulatory Visit: Payer: Self-pay

## 2020-03-01 DIAGNOSIS — L409 Psoriasis, unspecified: Secondary | ICD-10-CM

## 2020-03-01 MED ORDER — OTEZLA 30 MG PO TABS: 30.0000 mg | ORAL_TABLET | Freq: Two times a day (BID) | ORAL | 1 refills | Status: DC

## 2020-03-01 NOTE — Progress Notes (Signed)
Pt called for refill of otezla. States that it now needs to be filled with [facility] pharmacy.

## 2020-03-03 ENCOUNTER — Telehealth: Payer: Self-pay

## 2020-03-03 NOTE — Telephone Encounter (Signed)
I called patient regarding 2022 xtrac benefits. This is approved for her but she will have a $40 copay per treatment. Patient declined treatment at this time. She will call back in the spring to schedule.

## 2020-05-20 ENCOUNTER — Other Ambulatory Visit: Payer: Self-pay

## 2020-05-20 ENCOUNTER — Other Ambulatory Visit: Payer: Self-pay | Admitting: Internal Medicine

## 2020-05-20 ENCOUNTER — Ambulatory Visit
Admission: RE | Admit: 2020-05-20 | Discharge: 2020-05-20 | Disposition: A | Discharge status: Home / Self Care | Payer: Medicare PPO | Source: Ambulatory Visit | Attending: Internal Medicine | Admitting: Internal Medicine

## 2020-05-20 DIAGNOSIS — R102 Pelvic and perineal pain: Secondary | ICD-10-CM | POA: Diagnosis present

## 2020-05-20 DIAGNOSIS — R109 Unspecified abdominal pain: Secondary | ICD-10-CM

## 2020-06-09 ENCOUNTER — Ambulatory Visit: Payer: Medicare PPO | Admitting: Dermatology

## 2020-06-09 ENCOUNTER — Other Ambulatory Visit: Payer: Self-pay

## 2020-06-09 DIAGNOSIS — Z1283 Encounter for screening for malignant neoplasm of skin: Secondary | ICD-10-CM

## 2020-06-09 DIAGNOSIS — L409 Psoriasis, unspecified: Secondary | ICD-10-CM

## 2020-06-09 DIAGNOSIS — L578 Other skin changes due to chronic exposure to nonionizing radiation: Secondary | ICD-10-CM | POA: Diagnosis not present

## 2020-06-09 DIAGNOSIS — L820 Inflamed seborrheic keratosis: Secondary | ICD-10-CM | POA: Diagnosis not present

## 2020-06-09 DIAGNOSIS — L304 Erythema intertrigo: Secondary | ICD-10-CM | POA: Diagnosis not present

## 2020-06-09 DIAGNOSIS — L821 Other seborrheic keratosis: Secondary | ICD-10-CM

## 2020-06-09 DIAGNOSIS — D229 Melanocytic nevi, unspecified: Secondary | ICD-10-CM

## 2020-06-09 DIAGNOSIS — L408 Other psoriasis: Secondary | ICD-10-CM | POA: Diagnosis not present

## 2020-06-09 DIAGNOSIS — D1800 Hemangioma unspecified site: Secondary | ICD-10-CM

## 2020-06-09 DIAGNOSIS — L814 Other melanin hyperpigmentation: Secondary | ICD-10-CM

## 2020-06-09 NOTE — Progress Notes (Signed)
Follow-Up Visit   Subjective  Victoria Flynn is a 75 y.o. female who presents for the following: Psoriasis (Bil elbows, umbilicus, groin Otezla 30mg  1 po bid, TMC 01% cr not using, Calcipotrene cr 2x/wk, taclonex suspension in umbilicus prn, itching, no s/e from [place], pt was approved for Xtrac but did not start), Rash (Groin, pt not sure if psoriasis, used vaseline/destin and a prescription cream maybe betamethasone, didn't help, itchy), and Total body skin exam. The patient presents for Total-Body Skin Exam (TBSE) for skin cancer screening and mole check.  Patient accompanied by husband.  The following portions of the chart were reviewed this encounter and updated as appropriate:   Tobacco  Allergies  Meds  Problems  Med Hx  Surg Hx  Fam Hx     Review of Systems:  No other skin or systemic complaints except as noted in HPI or Assessment and Plan.  Objective  Well appearing patient in no apparent distress; mood and affect are within normal limits.  A focused examination was performed including elbows, umbilicus. Relevant physical exam findings are noted in the Assessment and Plan.  Objective  bil elbows, umbilicus, groin, inframammary: Well-demarcated erythematous papules/plaques with silvery scale, guttate pink scaly papules bil elbows, umbilicus  Objective  L calf x 2, R neck x 2 (3): Erythematous keratotic or waxy stuck-on papule or plaque.   Objective  bil inframammary area, groin: Erythema inframammary, groin   Assessment & Plan    Lentigines - Scattered tan macules - Due to sun exposure - Benign-appering, observe - Recommend daily broad spectrum sunscreen SPF 30+ to sun-exposed areas, reapply every 2 hours as needed. - Call for any changes  Seborrheic Keratoses - Stuck-on, waxy, tan-brown papules and/or plaques  - Benign-appearing - Discussed benign etiology and prognosis. - Observe - Call for any changes  Melanocytic Nevi - Tan-brown and/or  pink-flesh-colored symmetric macules and papules - Benign appearing on exam today - Observation - Call clinic for new or changing moles - Recommend daily use of broad spectrum spf 30+ sunscreen to sun-exposed areas.   Hemangiomas - Red papules - Discussed benign nature - Observe - Call for any changes  Actinic Damage - Chronic condition, secondary to cumulative UV/sun exposure - diffuse scaly erythematous macules with underlying dyspigmentation - Recommend daily broad spectrum sunscreen SPF 30+ to sun-exposed areas, reapply every 2 hours as needed.  - Staying in the shade or wearing long sleeves, sun glasses (UVA+UVB protection) and wide brim hats (4-inch brim around the entire circumference of the hat) are also recommended for sun protection.  - Call for new or changing lesions.  Skin cancer screening performed today.  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 It is associated with an increased risk of inflammatory bowel disease, heart disease, non-alcoholic fatty liver disease, and depression.    Cont Otezla 30mg  1 po bid Restart TMC 0.1% cr qd up to 5d/wk aa elbows, umbilicus, avoid f/g/a Cont Calcipotriene cr increasing to qd to aa elbows, umbilicus Start Intertrigo mix (skin medicinals) qhs to inframammary/groin until clear, then prn flares Start Xtrac Laser bid   Total Surface Area: 20 cm2 Total Energy: 6.00 J  Side effects of Otezla (apremilast) include diarrhea, nausea, headache, upper respiratory  infection, depression, and weight decrease (5-10%). It should only be taken by pregnant women after a discussion regarding risks and benefits with their  doctor. Goal is control of skin condition, not cure.  The use of [place] requires long term medication management, including periodic office visits.  Topical steroids (such as triamcinolone, fluocinolone, fluocinonide, mometasone, clobetasol, halobetasol, betamethasone, hydrocortisone) can cause thinning and lightening of the skin if they are used for too long in the same area. Your physician has selected the right strength medicine for your problem and area affected on the body. Please use your medication only as directed by your physician to prevent side effects.    Other Related Procedures XTRAC Treatment  Other Related Medications triamcinolone cream (KENALOG) 0.1 % Apremilast (OTEZLA) 30 MG TABS calcipotriene (DOVONOX) 0.005 % cream Apremilast (OTEZLA) 30 MG TABS  Inflamed seborrheic keratosis (3) L calf x 2, R neck x 2 Destruction of lesion - L calf x 2, R neck x 2 Complexity: simple   Destruction method: cryotherapy   Informed consent: discussed and consent obtained   Timeout:  patient name, date of birth, surgical site, and procedure verified Lesion destroyed using liquid nitrogen: Yes   Region frozen until ice ball extended beyond lesion: Yes   Outcome: patient tolerated procedure well with no complications   Post-procedure details: wound care instructions given    Intertrigo bil inframammary area, groin With Psoriasis Inversa  Intertrigo is a chronic recurrent rash that occurs in skin fold areas that may be associated with friction; heat; moisture; yeast; fungus; and bacteria.  It is exacerbated by increased movement / activity; sweating; and higher atmospheric temperature.  Start [facility] Iodoquinol 1%, Hydrocortisone 2.5%, Niacinamide 2% Cream qhs to affected areas for up to two weeks.  The patient was advised this is not covered by insurance since it is made by a compounding pharmacy. They will receive an email to check out and the medication will be mailed to their  home.    May consider bx (not consistent with Lichen sclerosis but may consider if not improving)  Skin cancer screening  Return in about 5 weeks (around 07/14/2020) for Psoriasis/Intertrigo/Psorisis Inversa f/u, Xtrax 2d/wk.   I, Othelia Pulling, RMA, am acting as scribe for Sarina Ser, MD .  Documentation: I have reviewed the above documentation for accuracy and completeness, and I agree with the above.  Sarina Ser, MD

## 2020-06-09 NOTE — Patient Instructions (Addendum)
If you have any questions or concerns for your doctor, please call our main line at [phone] and press option 4 to reach your doctor's medical assistant. If no one answers, please leave a voicemail as directed and we will return your call as soon as possible. Messages left after 4 pm will be answered the following business day.   You may also send Victoria Flynn a message via [place]. We typically respond to MyChart messages within 1-2 business days.  For prescription refills, please ask your pharmacy to contact our office. Our fax number is [phone].  If you have an urgent issue when the clinic is closed that cannot wait until the next business day, you can page your doctor at the number below.    Please note that while we do our best to be available for urgent issues outside of office hours, we are not available 24/7.   If you have an urgent issue and are unable to reach Victoria Flynn, you may choose to seek medical care at your doctor's office, retail clinic, urgent care center, or emergency room.  If you have a medical emergency, please immediately call 911 or go to the emergency department.  Pager Numbers  - Dr. Nehemiah Massed: [phone]  - Dr. Laurence Ferrari: [phone]  - Dr. Nicole Kindred: [phone]  In the event of inclement weather, please call our main line at [phone] for an update on the status of any delays or closures.  Dermatology Medication Tips: Please keep the boxes that topical medications come in in order to help keep track of the instructions about where and how to use these. Pharmacies typically print the medication instructions only on the boxes and not directly on the medication tubes.   If your medication is too expensive, please contact our office at [phone] option 4 or send Victoria Flynn a message through [place].   We are unable to tell what your co-pay for medications will be in advance as this is different depending on your insurance coverage. However, we may be able to find a substitute  medication at lower cost or fill out paperwork to get insurance to cover a needed medication.   If a prior authorization is required to get your medication covered by your insurance company, please allow Victoria Flynn 1-2 business days to complete this process.  Drug prices often vary depending on where the prescription is filled and some pharmacies may offer cheaper prices.  The website www.goodrx.com contains coupons for medications through different pharmacies. The prices here do not account for what the cost may be with help from insurance (it may be cheaper with your insurance), but the website can give you the price if you did not use any insurance.  - You can print the associated coupon and take it with your prescription to the pharmacy.  - You may also stop by our office during regular business hours and pick up a GoodRx coupon card.  - If you need your prescription sent electronically to a different pharmacy, notify our office through [facility] or by phone at [phone] option 4.   Instructions for [facility] Medications Intertrigo mix for under breast and in groin  One or more of your medications was sent to the [facility] mail order compounding pharmacy. You will receive an email from them and can purchase the medicine through that link. It will then be mailed to your home at the address you confirmed. If for any reason you do not receive an email from them, please check your spam  folder. If you still do not find the email, please let Victoria Flynn know. [facility] phone number is [phone].  For Psoriasis  Continue Otezla 30mg  1 pill 2 times a day Restart Triamcinolone cream once daily up to 5 days a week to elbows and bellybutton, don't use on face under arms or in groin Continue Calcipotriene cream once daily to elbows and bellybutton  Start Xtrac laser 2 days a week

## 2020-06-11 ENCOUNTER — Encounter: Payer: Self-pay | Admitting: Dermatology

## 2020-06-15 ENCOUNTER — Other Ambulatory Visit: Payer: Self-pay

## 2020-06-15 ENCOUNTER — Ambulatory Visit: Payer: Medicare PPO

## 2020-06-15 DIAGNOSIS — L409 Psoriasis, unspecified: Secondary | ICD-10-CM

## 2020-06-15 NOTE — Progress Notes (Signed)
Total Surface Area: 28cm2 Total Energy: 9.66J

## 2020-06-17 ENCOUNTER — Ambulatory Visit: Payer: Medicare PPO

## 2020-06-17 ENCOUNTER — Other Ambulatory Visit: Payer: Self-pay

## 2020-06-17 DIAGNOSIS — L409 Psoriasis, unspecified: Secondary | ICD-10-CM

## 2020-06-17 NOTE — Progress Notes (Signed)
Total Surface Area: 28cm2 Total Energy: 11.09J

## 2020-06-22 ENCOUNTER — Ambulatory Visit: Payer: Medicare PPO

## 2020-06-22 ENCOUNTER — Other Ambulatory Visit: Payer: Self-pay

## 2020-06-22 DIAGNOSIS — L409 Psoriasis, unspecified: Secondary | ICD-10-CM | POA: Diagnosis not present

## 2020-06-22 NOTE — Progress Notes (Signed)
Total Surface Area: 32cm2 Total Energy: 14.56J

## 2020-06-24 ENCOUNTER — Ambulatory Visit: Payer: Medicare PPO

## 2020-06-24 ENCOUNTER — Other Ambulatory Visit: Payer: Self-pay

## 2020-06-24 DIAGNOSIS — L409 Psoriasis, unspecified: Secondary | ICD-10-CM | POA: Diagnosis not present

## 2020-06-24 NOTE — Progress Notes (Signed)
Total Surface Area: 28cm2 Total Energy: 14.64J

## 2020-06-28 ENCOUNTER — Ambulatory Visit: Payer: Medicare PPO

## 2020-06-28 ENCOUNTER — Other Ambulatory Visit: Payer: Self-pay

## 2020-06-28 DIAGNOSIS — L409 Psoriasis, unspecified: Secondary | ICD-10-CM | POA: Diagnosis not present

## 2020-06-28 NOTE — Progress Notes (Signed)
Total Surface Area: 36cm2 Total Energy: 21.64J

## 2020-06-30 ENCOUNTER — Ambulatory Visit: Payer: Medicare PPO

## 2020-06-30 ENCOUNTER — Other Ambulatory Visit: Payer: Self-pay

## 2020-06-30 DIAGNOSIS — L409 Psoriasis, unspecified: Secondary | ICD-10-CM | POA: Diagnosis not present

## 2020-06-30 NOTE — Progress Notes (Signed)
Total Surface Area: 44cm2 Total Energy: 30.40J

## 2020-07-05 ENCOUNTER — Ambulatory Visit: Payer: Medicare PPO

## 2020-07-05 ENCOUNTER — Other Ambulatory Visit: Payer: Self-pay

## 2020-07-05 DIAGNOSIS — L409 Psoriasis, unspecified: Secondary | ICD-10-CM

## 2020-07-05 NOTE — Progress Notes (Signed)
Total Surface Area: 28cm2 Total Energy: 22.23J

## 2020-07-07 ENCOUNTER — Other Ambulatory Visit: Payer: Self-pay

## 2020-07-07 ENCOUNTER — Ambulatory Visit: Payer: Medicare PPO

## 2020-07-07 DIAGNOSIS — L409 Psoriasis, unspecified: Secondary | ICD-10-CM | POA: Diagnosis not present

## 2020-07-07 NOTE — Progress Notes (Signed)
Total Surface Area: 28cm2 Total Energy: 25.56J

## 2020-07-12 ENCOUNTER — Other Ambulatory Visit: Payer: Self-pay

## 2020-07-12 ENCOUNTER — Ambulatory Visit: Payer: Medicare PPO

## 2020-07-12 DIAGNOSIS — L409 Psoriasis, unspecified: Secondary | ICD-10-CM

## 2020-07-12 NOTE — Progress Notes (Signed)
Total Surface Area: 28cm2 Total Energy: 29.37J

## 2020-07-15 ENCOUNTER — Other Ambulatory Visit: Payer: Self-pay

## 2020-07-15 ENCOUNTER — Ambulatory Visit: Payer: Medicare PPO

## 2020-07-15 DIAGNOSIS — L409 Psoriasis, unspecified: Secondary | ICD-10-CM | POA: Diagnosis not present

## 2020-07-15 NOTE — Progress Notes (Signed)
Total Surface Area: 28cm2 Total Energy: 33.77J

## 2020-07-20 ENCOUNTER — Other Ambulatory Visit: Payer: Self-pay

## 2020-07-20 ENCOUNTER — Ambulatory Visit: Payer: Medicare PPO

## 2020-07-20 DIAGNOSIS — L409 Psoriasis, unspecified: Secondary | ICD-10-CM | POA: Diagnosis not present

## 2020-07-20 NOTE — Progress Notes (Signed)
Total Surface Area: 28cm2 Total Energy: 38.81J

## 2020-07-21 ENCOUNTER — Ambulatory Visit: Payer: Medicare PPO

## 2020-07-22 ENCOUNTER — Ambulatory Visit: Payer: Medicare PPO

## 2020-07-22 ENCOUNTER — Other Ambulatory Visit: Payer: Self-pay

## 2020-07-22 DIAGNOSIS — L409 Psoriasis, unspecified: Secondary | ICD-10-CM

## 2020-07-22 NOTE — Progress Notes (Signed)
Total Surface Area: 32cm2 Total Energy: 50.98J

## 2020-07-29 ENCOUNTER — Ambulatory Visit (INDEPENDENT_AMBULATORY_CARE_PROVIDER_SITE_OTHER): Payer: Medicare PPO

## 2020-07-29 ENCOUNTER — Other Ambulatory Visit: Payer: Self-pay

## 2020-07-29 ENCOUNTER — Ambulatory Visit: Payer: Medicare PPO | Admitting: Dermatology

## 2020-07-29 DIAGNOSIS — L304 Erythema intertrigo: Secondary | ICD-10-CM | POA: Diagnosis not present

## 2020-07-29 DIAGNOSIS — L409 Psoriasis, unspecified: Secondary | ICD-10-CM

## 2020-07-29 DIAGNOSIS — L820 Inflamed seborrheic keratosis: Secondary | ICD-10-CM

## 2020-07-29 DIAGNOSIS — L408 Other psoriasis: Secondary | ICD-10-CM

## 2020-07-29 DIAGNOSIS — D485 Neoplasm of uncertain behavior of skin: Secondary | ICD-10-CM

## 2020-07-29 DIAGNOSIS — C4492 Squamous cell carcinoma of skin, unspecified: Secondary | ICD-10-CM

## 2020-07-29 DIAGNOSIS — C44729 Squamous cell carcinoma of skin of left lower limb, including hip: Secondary | ICD-10-CM | POA: Diagnosis not present

## 2020-07-29 HISTORY — DX: Squamous cell carcinoma of skin, unspecified: C44.92

## 2020-07-29 NOTE — Progress Notes (Signed)
Total Surface Area: 12cm2 Total Energy: 19.12J

## 2020-07-29 NOTE — Progress Notes (Signed)
Follow-Up Visit   Subjective  Victoria Flynn is a 75 y.o. female who presents for the following: Psoriasis (Patient currently using TMC 0.1% cream, and Calcipotriene cream as well as Otezla 30mg  po BID and having Xtrac laser treatments. She is tolerating medications well. ) and intertrigo (With psoriasis inversa of the groin - she is currently using the [facility] intertrigo mix, but still c/o itching in the pubic area).  The following portions of the chart were reviewed this encounter and updated as appropriate:   Tobacco  Allergies  Meds  Problems  Med Hx  Surg Hx  Fam Hx      Review of Systems:  No other skin or systemic complaints except as noted in HPI or Assessment and Plan.  Objective  Well appearing patient in no apparent distress; mood and affect are within normal limits.  A focused examination was performed including the arms and legs. Relevant physical exam findings are noted in the Assessment and Plan.  Left Elbow - Posterior Thin plaques on the elbows, knees, and periumbilical areas.  L popliteal 1.0 cm hyperkeratotic papule   L popliteal x 6, scalp x 1 for a total of 7 (7) Erythematous keratotic or waxy stuck-on papule or plaque.   Assessment & Plan  Psoriasis Left Elbow - Posterior With psoriasis inversa -   Psoriasis is a chronic non-curable, but treatable genetic/hereditary disease that may have other systemic features affecting other organ systems such as joints (Psoriatic Arthritis). It is associated with an increased risk of inflammatory bowel disease, heart disease, non-alcoholic fatty liver disease, and depression.    Continue Otezla 30mg  po BID. Side effects of Otezla (apremilast) include diarrhea, nausea, headache, upper respiratory infection, depression, and weight decrease (5-10%). It should only be taken by pregnant women after a discussion regarding risks and benefits with their doctor. Goal is control of skin condition, not cure.  The use of  [place] requires long term medication management, including periodic office visits.  Continue TMC 0.1% cream to aa's psoriasis QD-BID PRN. Topical steroids (such as triamcinolone, fluocinolone, fluocinonide, mometasone, clobetasol, halobetasol, betamethasone, hydrocortisone) can cause thinning and lightening of the skin if they are used for too long in the same area. Your physician has selected the right strength medicine for your problem and area affected on the body. Please use your medication only as directed by your physician to prevent side effects.   Continue Calcipotriene 0.005% cream QD to aa's PRN.  Continue Xtrac laser treatment.   Related Medications triamcinolone cream (KENALOG) 0.1 % Apply to aa's psoriasis BID PRN. Avoid f/g/a.  Apremilast (OTEZLA) 30 MG TABS Take 1 tablet (30 mg total) by mouth 2 (two) times daily.  calcipotriene (DOVONOX) 0.005 % cream Apply topically daily. Qd to aa psoriasis  Apremilast (OTEZLA) 30 MG TABS Take 1 tablet (30 mg total) by mouth 2 (two) times daily.  Erythema intertrigo groin With psoriasis inversa -  Add Calcipotriene QOD alternating with [facility] intertrigo mix QOD. If not improving consider adding Mometasone cream.   Intertrigo is a chronic recurrent rash that occurs in skin fold areas that may be associated with friction; heat; moisture; yeast; fungus; and bacteria.  It is exacerbated by increased movement / activity; sweating; and higher atmospheric temperature.  Neoplasm of uncertain behavior of skin L popliteal Epidermal / dermal shaving  Lesion diameter (cm):  1 Informed consent: discussed and consent obtained   Timeout: patient name, date of birth, surgical site, and procedure verified   Procedure  prep:  Patient was prepped and draped in usual sterile fashion Prep type:  Isopropyl alcohol Anesthesia: the lesion was anesthetized in a standard fashion   Anesthetic:  1% lidocaine w/ epinephrine 1-100,000 buffered w/  8.4% NaHCO3 Instrument used: flexible razor blade   Hemostasis achieved with: pressure, aluminum chloride and electrodesiccation   Outcome: patient tolerated procedure well   Post-procedure details: sterile dressing applied and wound care instructions given   Dressing type: bandage and petrolatum    Destruction of lesion Complexity: extensive   Destruction method: electrodesiccation and curettage   Informed consent: discussed and consent obtained   Timeout:  patient name, date of birth, surgical site, and procedure verified Procedure prep:  Patient was prepped and draped in usual sterile fashion Prep type:  Isopropyl alcohol Anesthesia: the lesion was anesthetized in a standard fashion   Anesthetic:  1% lidocaine w/ epinephrine 1-100,000 buffered w/ 8.4% NaHCO3 Curettage performed in three different directions: Yes   Electrodesiccation performed over the curetted area: Yes   Lesion length (cm):  1 Lesion width (cm):  1 Margin per side (cm):  0.2 Final wound size (cm):  1.4 Hemostasis achieved with:  pressure, aluminum chloride and electrodesiccation Outcome: patient tolerated procedure well with no complications   Post-procedure details: sterile dressing applied and wound care instructions given   Dressing type: bandage and petrolatum    Specimen 1 - Surgical pathology Differential Diagnosis: D48.5 r/o SCC  ED&C today  Check Margins: No 1.0 cm hyperkeratotic papule  Inflamed seborrheic keratosis L popliteal x 6, scalp x 1 for a total of 7  Destruction of lesion - L popliteal x 6, scalp x 1 for a total of 7 Complexity: simple   Destruction method: cryotherapy   Informed consent: discussed and consent obtained   Timeout:  patient name, date of birth, surgical site, and procedure verified Lesion destroyed using liquid nitrogen: Yes   Region frozen until ice ball extended beyond lesion: Yes   Outcome: patient tolerated procedure well with no complications   Post-procedure  details: wound care instructions given    Return for for nurse visit to continue Xtrac laser twice per week; 6 mths with Dr. Nehemiah Massed.  Luther Redo, CMA, am acting as scribe for Sarina Ser, MD .  Documentation: I have reviewed the above documentation for accuracy and completeness, and I agree with the above.  Sarina Ser, MD

## 2020-07-29 NOTE — Patient Instructions (Signed)

## 2020-08-02 ENCOUNTER — Encounter: Payer: Self-pay | Admitting: Dermatology

## 2020-08-02 ENCOUNTER — Other Ambulatory Visit: Payer: Self-pay

## 2020-08-02 DIAGNOSIS — L409 Psoriasis, unspecified: Secondary | ICD-10-CM

## 2020-08-02 MED ORDER — CALCIPOTRIENE 0.005 % EX CREA: TOPICAL_CREAM | Freq: Every day | CUTANEOUS | 5 refills | Status: DC

## 2020-08-02 MED ORDER — TRIAMCINOLONE ACETONIDE 0.1 % EX CREA: TOPICAL_CREAM | CUTANEOUS | 2 refills | Status: DC

## 2020-08-02 NOTE — Progress Notes (Signed)
Patient did not have Triamcinolone or Calcipotriene Cream at home. Refills sent in.

## 2020-08-05 ENCOUNTER — Telehealth: Payer: Self-pay

## 2020-08-05 NOTE — Telephone Encounter (Signed)
Advised patient of results/hd  

## 2020-08-05 NOTE — Telephone Encounter (Signed)
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

## 2020-09-15 ENCOUNTER — Other Ambulatory Visit: Payer: Self-pay

## 2020-09-15 MED ORDER — OTEZLA 30 MG PO TABS: 30.0000 mg | ORAL_TABLET | Freq: Two times a day (BID) | ORAL | 3 refills | Status: DC

## 2020-09-15 NOTE — Progress Notes (Signed)
[facility] [place] [facility]

## 2020-12-02 ENCOUNTER — Other Ambulatory Visit: Payer: Self-pay

## 2020-12-02 DIAGNOSIS — L409 Psoriasis, unspecified: Secondary | ICD-10-CM

## 2020-12-02 MED ORDER — OTEZLA 30 MG PO TABS: 30.0000 mg | ORAL_TABLET | Freq: Two times a day (BID) | ORAL | 2 refills | Status: DC

## 2020-12-02 NOTE — Progress Notes (Signed)
Pt states that she was told by pharmacy that she needs refills of Otezla.

## 2020-12-06 ENCOUNTER — Ambulatory Visit: Payer: Medicare PPO | Admitting: Dermatology

## 2020-12-06 ENCOUNTER — Other Ambulatory Visit: Payer: Self-pay

## 2020-12-06 DIAGNOSIS — L820 Inflamed seborrheic keratosis: Secondary | ICD-10-CM | POA: Diagnosis not present

## 2020-12-06 DIAGNOSIS — L409 Psoriasis, unspecified: Secondary | ICD-10-CM | POA: Diagnosis not present

## 2020-12-06 MED ORDER — VTAMA 1 % EX CREA: 1.0000 "application " | TOPICAL_CREAM | Freq: Every day | CUTANEOUS | 2 refills | Status: DC

## 2020-12-06 MED ORDER — OTEZLA 30 MG PO TABS: 30.0000 mg | ORAL_TABLET | Freq: Two times a day (BID) | ORAL | 1 refills | Status: DC

## 2020-12-06 NOTE — Patient Instructions (Signed)

## 2020-12-06 NOTE — Progress Notes (Signed)
   Follow-Up Visit   Subjective  Victoria Flynn is a 75 y.o. female who presents for the following: Rash (Patient here today for itchy rash at buttocks. She was seen 07/29/20 and was given calcipotriene and [facility] iodoquinol/HC/niacinamide to alternate daily. Patient advises rash is no better, may have worsened. ). Patient also has a spot on her abdomen that is irritating and has bled. Patient accompanied by husband who contributes to history.   The following portions of the chart were reviewed this encounter and updated as appropriate:   Tobacco  Allergies  Meds  Problems  Med Hx  Surg Hx  Fam Hx     Review of Systems:  No other skin or systemic complaints except as noted in HPI or Assessment and Plan.  Objective  Well appearing patient in no apparent distress; mood and affect are within normal limits.  A focused examination was performed including groin, elbows. Relevant physical exam findings are noted in the Assessment and Plan.  Right Lower Leg - Anterior Shiny pink eruption of labia, groin, perineum  Plaques at elbows  left infra abdominal Erythematous keratotic or waxy stuck-on papule or plaque.    Assessment & Plan  Psoriasis Groin pubic and genital area  Psoriasis is a chronic non-curable, but treatable genetic/hereditary disease that may have other systemic features affecting other organ systems such as joints (Psoriatic Arthritis). It is associated with an increased risk of inflammatory bowel disease, heart disease, non-alcoholic fatty liver disease, and depression.    Patient with headaches, present prior to starting Otezla No kidney or liver problems. No congestive heart failure. Discussed switching to [place] from [place] or adding [place].  Will start Vtama once daily to affected areas.  May continue calcipotriene and [facility] once daily, alternating days.  Patient advised to keep December appointment. Will need labs if switching to [place].    Tapinarof (VTAMA) 1 % CREA - Right Lower Leg - Anterior Apply 1 application topically daily.  Related Medications triamcinolone cream (KENALOG) 0.1 % Apply to aa's psoriasis BID PRN. Avoid f/g/a.  calcipotriene (DOVONOX) 0.005 % cream Apply topically daily. Qd to aa psoriasis  Apremilast (OTEZLA) 30 MG TABS Take 1 tablet (30 mg total) by mouth 2 (two) times daily.  Inflamed seborrheic keratosis left infra abdominal Destruction of lesion - left infra abdominal Complexity: simple   Destruction method: cryotherapy   Informed consent: discussed and consent obtained   Timeout:  patient name, date of birth, surgical site, and procedure verified Lesion destroyed using liquid nitrogen: Yes   Region frozen until ice ball extended beyond lesion: Yes   Outcome: patient tolerated procedure well with no complications   Post-procedure details: wound care instructions given    Return for as scheduled, Psoriasis.  Graciella Belton, RMA, am acting as scribe for Sarina Ser, MD . Documentation: I have reviewed the above documentation for accuracy and completeness, and I agree with the above.  Sarina Ser, MD

## 2020-12-07 ENCOUNTER — Encounter: Payer: Self-pay | Admitting: Dermatology

## 2020-12-29 ENCOUNTER — Other Ambulatory Visit: Payer: Self-pay | Admitting: Internal Medicine

## 2021-01-20 ENCOUNTER — Other Ambulatory Visit: Payer: Self-pay

## 2021-01-20 ENCOUNTER — Ambulatory Visit: Payer: Medicare PPO | Admitting: Dermatology

## 2021-01-20 DIAGNOSIS — L409 Psoriasis, unspecified: Secondary | ICD-10-CM

## 2021-01-20 NOTE — Progress Notes (Deleted)
   Follow-Up Visit   Subjective  Victoria Flynn is a 75 y.o. female who presents for the following: No chief complaint on file..  ***  The following portions of the chart were reviewed this encounter and updated as appropriate:      {Review of Systems:34166::"No other skin or systemic complaints."}  Objective  Well appearing patient in no apparent distress; mood and affect are within normal limits.  {TDHR:[id number]::"G full examination was performed including scalp, head, eyes, ears, nose, lips, neck, chest, axillae, abdomen, back, buttocks, bilateral upper extremities, bilateral lower extremities, hands, feet, fingers, toes, fingernails, and toenails. All findings within normal limits unless otherwise noted below."}   Assessment & Plan   No follow-ups on file.  IHarriett Sine, CMA, am acting as scribe for Sarina Ser, MD.

## 2021-01-20 NOTE — Patient Instructions (Signed)

## 2021-01-20 NOTE — Progress Notes (Signed)
   Follow-Up Visit   Subjective  Victoria Flynn is a 75 y.o. female who presents for the following: Psoriasis (Pt treating with vtama qd-prn. She states that she is just about clear from any plaques. Pt states that she is very happy with the almost clear results she is getting from the vtama. Also taking Otezla 30 mg BID).  The following portions of the chart were reviewed this encounter and updated as appropriate:  Tobacco  Allergies  Meds  Problems  Med Hx  Surg Hx  Fam Hx     Review of Systems: No other skin or systemic complaints except as noted in HPI or Assessment and Plan.  Objective  Well appearing patient in no apparent distress; mood and affect are within normal limits.  A focused examination was performed including upper extremities, including the arms, hands, fingers, and fingernails. Relevant physical exam findings are noted in the Assessment and Plan.  b/l elbows Well-demarcated erythematous papules/plaques with silvery scale, guttate pink scaly papules.   Assessment & Plan  Psoriasis b/l elbows  Much improved. Not at goal.   Psoriasis is a chronic non-curable, but treatable genetic/hereditary disease that may have other systemic features affecting other organ systems such as joints (Psoriatic Arthritis). It is associated with an increased risk of inflammatory bowel disease, heart disease, non-alcoholic fatty liver disease, and depression.     Continue Vtama  Continue Otezla 30 mg BID  Related Medications triamcinolone cream (KENALOG) 0.1 % Apply to aa's psoriasis BID PRN. Avoid f/g/a.  calcipotriene (DOVONOX) 0.005 % cream Apply topically daily. Qd to aa psoriasis  Tapinarof (VTAMA) 1 % CREA Apply 1 application topically daily.  Apremilast (OTEZLA) 30 MG TABS Take 1 tablet (30 mg total) by mouth 2 (two) times daily.  Return in about 6 months (around 07/21/2021) for psoriasis f/u.  IHarriett Sine, CMA, am acting as scribe for Sarina Ser,  MD. Documentation: I have reviewed the above documentation for accuracy and completeness, and I agree with the above.  Sarina Ser, MD

## 2021-02-01 ENCOUNTER — Encounter: Payer: Self-pay | Admitting: Dermatology

## 2021-02-02 ENCOUNTER — Other Ambulatory Visit: Payer: Self-pay | Admitting: Internal Medicine

## 2021-02-02 DIAGNOSIS — Z1231 Encounter for screening mammogram for malignant neoplasm of breast: Secondary | ICD-10-CM

## 2021-03-16 ENCOUNTER — Other Ambulatory Visit: Payer: Self-pay

## 2021-03-16 ENCOUNTER — Ambulatory Visit
Admission: RE | Admit: 2021-03-16 | Discharge: 2021-03-16 | Disposition: A | Discharge status: Home / Self Care | Payer: Medicare PPO | Source: Ambulatory Visit | Attending: Internal Medicine | Admitting: Internal Medicine

## 2021-03-16 DIAGNOSIS — Z1231 Encounter for screening mammogram for malignant neoplasm of breast: Secondary | ICD-10-CM | POA: Insufficient documentation

## 2021-03-22 ENCOUNTER — Other Ambulatory Visit: Payer: Self-pay | Admitting: Internal Medicine

## 2021-03-22 DIAGNOSIS — N6489 Other specified disorders of breast: Secondary | ICD-10-CM

## 2021-03-22 DIAGNOSIS — R928 Other abnormal and inconclusive findings on diagnostic imaging of breast: Secondary | ICD-10-CM

## 2021-03-31 ENCOUNTER — Ambulatory Visit
Admission: RE | Admit: 2021-03-31 | Discharge: 2021-03-31 | Disposition: A | Discharge status: Home / Self Care | Payer: Medicare PPO | Source: Ambulatory Visit | Attending: Internal Medicine | Admitting: Internal Medicine

## 2021-03-31 ENCOUNTER — Other Ambulatory Visit: Payer: Self-pay

## 2021-03-31 DIAGNOSIS — R928 Other abnormal and inconclusive findings on diagnostic imaging of breast: Secondary | ICD-10-CM

## 2021-03-31 DIAGNOSIS — N6489 Other specified disorders of breast: Secondary | ICD-10-CM | POA: Diagnosis present

## 2021-07-27 ENCOUNTER — Ambulatory Visit: Payer: Medicare PPO | Admitting: Dermatology

## 2021-09-06 ENCOUNTER — Other Ambulatory Visit: Payer: Self-pay

## 2021-09-06 ENCOUNTER — Ambulatory Visit: Payer: Medicare PPO | Admitting: Dermatology

## 2021-09-06 DIAGNOSIS — L409 Psoriasis, unspecified: Secondary | ICD-10-CM

## 2021-09-06 DIAGNOSIS — Z79899 Other long term (current) drug therapy: Secondary | ICD-10-CM | POA: Diagnosis not present

## 2021-09-06 DIAGNOSIS — Z85828 Personal history of other malignant neoplasm of skin: Secondary | ICD-10-CM

## 2021-09-06 DIAGNOSIS — L814 Other melanin hyperpigmentation: Secondary | ICD-10-CM

## 2021-09-06 DIAGNOSIS — L821 Other seborrheic keratosis: Secondary | ICD-10-CM

## 2021-09-06 DIAGNOSIS — L304 Erythema intertrigo: Secondary | ICD-10-CM

## 2021-09-06 DIAGNOSIS — L408 Other psoriasis: Secondary | ICD-10-CM

## 2021-09-06 DIAGNOSIS — L578 Other skin changes due to chronic exposure to nonionizing radiation: Secondary | ICD-10-CM

## 2021-09-06 DIAGNOSIS — D1800 Hemangioma unspecified site: Secondary | ICD-10-CM

## 2021-09-06 MED ORDER — HYDROCORTISONE 2.5 % EX CREA: TOPICAL_CREAM | CUTANEOUS | 3 refills | Status: DC

## 2021-09-06 MED ORDER — OTEZLA 30 MG PO TABS: 30.0000 mg | ORAL_TABLET | Freq: Two times a day (BID) | ORAL | 1 refills | Status: DC

## 2021-09-06 MED ORDER — KETOCONAZOLE 2 % EX CREA: 1.0000 | TOPICAL_CREAM | CUTANEOUS | 6 refills | Status: AC

## 2021-09-06 NOTE — Progress Notes (Signed)
Patient called with pharmacy to send [facility] refills. aw

## 2021-09-06 NOTE — Progress Notes (Signed)
Follow-Up Visit   Subjective  Victoria Flynn is a 76 y.o. female who presents for the following: Psoriasis (With hx of Psoriasis inversa, Bil elbows, groin, 73mf/u, Otezla '30mg'$  1 po bid, pt does have headaches but she thinks it is related to her Cpap machine, no GI upset, no weight loss, no depression r/t to [place] Vt[place] prn, otc fungal powder).  Patient accompanied by husband.  The following portions of the chart were reviewed this encounter and updated as appropriate:   Tobacco  Allergies  Meds  Problems  Med Hx  Surg Hx  Fam Hx     Review of Systems:  No other skin or systemic complaints except as noted in HPI or Assessment and Plan.  Objective  Well appearing patient in no apparent distress; mood and affect are within normal limits.  A focused examination was performed including bil arms, legs, back, scalp. Relevant physical exam findings are noted in the Assessment and Plan.  bil elbows Scaly pink plaque right elbow  groin Groin not examined today   Assessment & Plan   History of Squamous Cell Carcinoma of the Skin - No evidence of recurrence today - No lymphadenopathy - Recommend regular full body skin exams - Recommend daily broad spectrum sunscreen SPF 30+ to sun-exposed areas, reapply every 2 hours as needed.  - Call if any new or changing lesions are noted between office visits - L popliteal  Seborrheic Keratoses - Stuck-on, waxy, tan-brown papules and/or plaques  - Benign-appearing - Discussed benign etiology and prognosis. - Observe - Call for any changes - scalp, discussed LN2 pt declines  Hemangiomas - Red papules - Discussed benign nature - Observe - Call for any changes  Lentigines - Scattered tan macules - Due to sun exposure - Benign-appering, observe - Recommend daily broad spectrum sunscreen SPF 30+ to sun-exposed areas, reapply every 2 hours as needed. - Call for any changes  Actinic Damage - chronic, secondary to cumulative UV  radiation exposure/sun exposure over time - diffuse scaly erythematous macules with underlying dyspigmentation - Recommend daily broad spectrum sunscreen SPF 30+ to sun-exposed areas, reapply every 2 hours as needed.  - Recommend staying in the shade or wearing long sleeves, sun glasses (UVA+UVB protection) and wide brim hats (4-inch brim around the entire circumference of the hat). - Call for new or changing lesions.   Psoriasis With Psoriasis Inversa bil elbows Chronic and persistent condition with duration or expected duration over one year. Condition is symptomatic / bothersome to patient. Not to goal.  But doing well overall. Psoriasis is a chronic non-curable, but treatable genetic/hereditary disease that may have other systemic features affecting other organ systems such as joints (Psoriatic Arthritis). It is associated with an increased risk of inflammatory bowel disease, heart disease, non-alcoholic fatty liver disease, and depression.    No side effects associated with Otezla   Cont Otezla '30mg'$  1 po bid Cont Vtama cr qd prn flares Cont OTC fungal powder  Side effects of Otezla (apremilast) include diarrhea, nausea, headache, upper respiratory infection, depression, and weight decrease (5-10%). It should only be taken by pregnant women after a discussion regarding risks and benefits with their doctor. Goal is control of skin condition, not cure.  The use of ORutherford Nailrequires long term medication management, including periodic office visits.  Related Medications Tapinarof (VTAMA) 1 % CREA Apply 1 application topically daily.  Apremilast (OTEZLA) 30 MG TABS Take 1 tablet (30 mg total) by mouth 2 (two) times daily.  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 Condition is symptomatic / bothersome to patient. Not to goal.  But doing well overall.  Intertrigo is a chronic recurrent rash that occurs in skin  fold areas that may be associated with friction; heat; moisture; yeast; fungus; and bacteria.  It is exacerbated by increased movement / activity; sweating; and higher atmospheric temperature.  Start Ketoconazole 2% cr Monday, Wednesday, Friday at bedtime prn flares Start HC 2.5% cr Tuesday, Thursday, Saturday at bedtime prn flares Cont OTC fungal powder  ketoconazole (NIZORAL) 2 % cream - groin Apply 1 Application topically 3 (three) times a week. Apply to rash in groin 3 nights weekly, Monday, Wednesday, Friday prn flares hydrocortisone 2.5 % cream - groin Apply topically 3 (three) times a week. Apply to rash in groin 3 nights weekly, Tuesday, Thursday, Saturday prn flares  Return in about 6 months (around 03/09/2022) for Psoriasis f/u, TBSE, Hx of SCC.  I, Othelia Pulling, RMA, am acting as scribe for Sarina Ser, MD . Documentation: I have reviewed the above documentation for accuracy and completeness, and I agree with the above.  Sarina Ser, MD

## 2021-09-06 NOTE — Patient Instructions (Signed)
Due to recent changes in healthcare laws, you may see results of your pathology and/or laboratory studies on MyChart before the doctors have had a chance to review them. We understand that in some cases there may be results that are confusing or concerning to you. Please understand that not all results are received at the same time and often the doctors may need to interpret multiple results in order to provide you with the best plan of care or course of treatment. Therefore, we ask that you please give us 2 business days to thoroughly review all your results before contacting the office for clarification. Should we see a critical lab result, you will be contacted sooner.   If You Need Anything After Your Visit  If you have any questions or concerns for your doctor, please call our main line at [phone] and press option 4 to reach your doctor's medical assistant. If no one answers, please leave a voicemail as directed and we will return your call as soon as possible. Messages left after 4 pm will be answered the following business day.   You may also send us a message via MyChart. We typically respond to MyChart messages within 1-2 business days.  For prescription refills, please ask your pharmacy to contact our office. Our fax number is [phone].  If you have an urgent issue when the clinic is closed that cannot wait until the next business day, you can page your doctor at the number below.    Please note that while we do our best to be available for urgent issues outside of office hours, we are not available 24/7.   If you have an urgent issue and are unable to reach us, you may choose to seek medical care at your doctor's office, retail clinic, urgent care center, or emergency room.  If you have a medical emergency, please immediately call 911 or go to the emergency department.  Pager Numbers  - Dr. Kowalski: [phone]  - Dr. Moye: [phone]  - Dr. Stewart:  [phone]  In the event of inclement weather, please call our main line at [phone] for an update on the status of any delays or closures.  Dermatology Medication Tips: Please keep the boxes that topical medications come in in order to help keep track of the instructions about where and how to use these. Pharmacies typically print the medication instructions only on the boxes and not directly on the medication tubes.   If your medication is too expensive, please contact our office at [phone] option 4 or send us a message through MyChart.   We are unable to tell what your co-pay for medications will be in advance as this is different depending on your insurance coverage. However, we may be able to find a substitute medication at lower cost or fill out paperwork to get insurance to cover a needed medication.   If a prior authorization is required to get your medication covered by your insurance company, please allow us 1-2 business days to complete this process.  Drug prices often vary depending on where the prescription is filled and some pharmacies may offer cheaper prices.  The website www.goodrx.com contains coupons for medications through different pharmacies. The prices here do not account for what the cost may be with help from insurance (it may be cheaper with your insurance), but the website can give you the price if you did not use any insurance.  - You can print the associated coupon and take it with   your prescription to the pharmacy.  - You may also stop by our office during regular business hours and pick up a GoodRx coupon card.  - If you need your prescription sent electronically to a different pharmacy, notify our office through [facility] MyChart or by phone at [phone] option 4.     Si Usted Necesita Algo Despus de Su Visita  Tambin puede enviarnos un mensaje a travs de MyChart. Por lo general respondemos a los mensajes de MyChart en el transcurso de 1 a 2  das hbiles.  Para renovar recetas, por favor pida a su farmacia que se ponga en contacto con nuestra oficina. Nuestro nmero de fax es el [phone].  Si tiene un asunto urgente cuando la clnica est cerrada y que no puede esperar hasta el siguiente da hbil, puede llamar/localizar a su doctor(a) al nmero que aparece a continuacin.   Por favor, tenga en cuenta que aunque hacemos todo lo posible para estar disponibles para asuntos urgentes fuera del horario de oficina, no estamos disponibles las 24 horas del da, los 7 das de la semana.   Si tiene un problema urgente y no puede comunicarse con nosotros, puede optar por buscar atencin mdica  en el consultorio de su doctor(a), en una clnica privada, en un centro de atencin urgente o en una sala de emergencias.  Si tiene una emergencia mdica, por favor llame inmediatamente al 911 o vaya a la sala de emergencias.  Nmeros de bper  - Dr. Kowalski: [phone]  - Dra. Moye: [phone]  - Dra. Stewart: [phone]  En caso de inclemencias del tiempo, por favor llame a nuestra lnea principal al [phone] para una actualizacin sobre el estado de cualquier retraso o cierre.  Consejos para la medicacin en dermatologa: Por favor, guarde las cajas en las que vienen los medicamentos de uso tpico para ayudarle a seguir las instrucciones sobre dnde y cmo usarlos. Las farmacias generalmente imprimen las instrucciones del medicamento slo en las cajas y no directamente en los tubos del medicamento.   Si su medicamento es muy caro, por favor, pngase en contacto con nuestra oficina llamando al [phone] y presione la opcin 4 o envenos un mensaje a travs de MyChart.   No podemos decirle cul ser su copago por los medicamentos por adelantado ya que esto es diferente dependiendo de la cobertura de su seguro. Sin embargo, es posible que podamos encontrar un medicamento sustituto a menor costo o llenar un formulario para que el  seguro cubra el medicamento que se considera necesario.   Si se requiere una autorizacin previa para que su compaa de seguros cubra su medicamento, por favor permtanos de 1 a 2 das hbiles para completar este proceso.  Los precios de los medicamentos varan con frecuencia dependiendo del lugar de dnde se surte la receta y alguna farmacias pueden ofrecer precios ms baratos.  El sitio web www.goodrx.com tiene cupones para medicamentos de diferentes farmacias. Los precios aqu no tienen en cuenta lo que podra costar con la ayuda del seguro (puede ser ms barato con su seguro), pero el sitio web puede darle el precio si no utiliz ningn seguro.  - Puede imprimir el cupn correspondiente y llevarlo con su receta a la farmacia.  - Tambin puede pasar por nuestra oficina durante el horario de atencin regular y recoger una tarjeta de cupones de GoodRx.  - Si necesita que su receta se enve electrnicamente a una farmacia diferente, informe a nuestra oficina a travs de MyChart de [place]   o por telfono llamando al [phone] y presione la opcin 4.  

## 2021-09-16 ENCOUNTER — Encounter: Payer: Self-pay | Admitting: Dermatology

## 2022-01-16 ENCOUNTER — Ambulatory Visit (LOCAL_COMMUNITY_HEALTH_CENTER): Payer: Medicare PPO

## 2022-01-16 DIAGNOSIS — Z23 Encounter for immunization: Secondary | ICD-10-CM

## 2022-01-16 DIAGNOSIS — Z7185 Encounter for immunization safety counseling: Secondary | ICD-10-CM

## 2022-01-16 NOTE — Progress Notes (Signed)
  Are you feeling sick today? No   Have you ever received a dose of COVID-19 Vaccine? [employer], [employer], [place], [place], Other) Yes   If yes, which vaccine and how many doses?   Pfizer and 5 doses   Did you bring the vaccination record card or other documentation?  Yes   Do you have a health condition or are undergoing treatment that makes you moderately or severely immunocompromised? This would include, but not be limited to: cancer, HIV, organ transplant, immunosuppressive therapy/high-dose corticosteroids, or moderate/severe primary immunodeficiency.  Yes, takes [place] for psrioasis  Have you received COVID-19 vaccine before or during hematopoietic cell transplant (HCT) or CAR-T-cell therapies? No  Have you ever had an allergic reaction to: (This would include a severe allergic reaction or a reaction that caused hives, swelling, or respiratory distress, including wheezing.) A component of a COVID-19 vaccine or a previous dose of COVID-19 vaccine? No   Have you ever had an allergic reaction to another vaccine (other thanCOVID-19 vaccine) or an injectable medication? (This would include a severe allergic reaction or a reaction that caused hives, swelling, or respiratory distress, including wheezing.)   No    Do you have a history of any of the following:  Myocarditis or Pericarditis No  Dermal fillers:  No  Multisystem Inflammatory Syndrome (MIS-C or MIS-A)? No  COVID-19 disease within the past 3 months? No  Vaccinated with monkeypox vaccine in the last 4 weeks? No  Tolerated Covid Pfizer Comirnaty [phone] (12 yrs+) well today. Stayed for 15 min observation without problem. Updated [facility] copy and Covid card given. Josie Saunders, RN

## 2022-03-09 ENCOUNTER — Ambulatory Visit: Payer: Medicare PPO | Admitting: Dermatology

## 2022-04-19 ENCOUNTER — Ambulatory Visit: Payer: Medicare PPO | Admitting: General Practice

## 2022-04-19 ENCOUNTER — Ambulatory Visit
Admission: RE | Admit: 2022-04-19 | Discharge: 2022-04-19 | Disposition: A | Discharge status: Home / Self Care | Payer: Medicare PPO | Attending: Internal Medicine | Admitting: Internal Medicine

## 2022-04-19 ENCOUNTER — Encounter
Admission: RE | Disposition: A | Discharge status: Home / Self Care | Payer: Self-pay | Source: Home / Self Care | Attending: Internal Medicine

## 2022-04-19 DIAGNOSIS — J449 Chronic obstructive pulmonary disease, unspecified: Secondary | ICD-10-CM | POA: Diagnosis not present

## 2022-04-19 DIAGNOSIS — G4730 Sleep apnea, unspecified: Secondary | ICD-10-CM | POA: Diagnosis not present

## 2022-04-19 DIAGNOSIS — Z96652 Presence of left artificial knee joint: Secondary | ICD-10-CM | POA: Diagnosis not present

## 2022-04-19 DIAGNOSIS — E039 Hypothyroidism, unspecified: Secondary | ICD-10-CM | POA: Diagnosis not present

## 2022-04-19 DIAGNOSIS — Z85828 Personal history of other malignant neoplasm of skin: Secondary | ICD-10-CM | POA: Diagnosis not present

## 2022-04-19 DIAGNOSIS — I10 Essential (primary) hypertension: Secondary | ICD-10-CM | POA: Insufficient documentation

## 2022-04-19 DIAGNOSIS — K640 First degree hemorrhoids: Secondary | ICD-10-CM | POA: Insufficient documentation

## 2022-04-19 DIAGNOSIS — Z1211 Encounter for screening for malignant neoplasm of colon: Secondary | ICD-10-CM | POA: Diagnosis present

## 2022-04-19 DIAGNOSIS — K5730 Diverticulosis of large intestine without perforation or abscess without bleeding: Secondary | ICD-10-CM | POA: Diagnosis not present

## 2022-04-19 DIAGNOSIS — Z96643 Presence of artificial hip joint, bilateral: Secondary | ICD-10-CM | POA: Diagnosis not present

## 2022-04-19 DIAGNOSIS — Z86010 Personal history of colonic polyps: Secondary | ICD-10-CM | POA: Insufficient documentation

## 2022-04-19 HISTORY — PX: COLONOSCOPY: SHX5424

## 2022-04-19 SURGERY — COLONOSCOPY
Anesthesia: General

## 2022-04-19 MED ORDER — PROPOFOL 500 MG/50ML IV EMUL
INTRAVENOUS | Status: DC | PRN
  Administered 2022-04-19: 155 ug/kg/min via INTRAVENOUS

## 2022-04-19 MED ORDER — PROPOFOL 10 MG/ML IV BOLUS
INTRAVENOUS | Status: DC | PRN
  Administered 2022-04-19: 60 mg via INTRAVENOUS

## 2022-04-19 MED ORDER — LIDOCAINE HCL (CARDIAC) PF 100 MG/5ML IV SOSY
PREFILLED_SYRINGE | INTRAVENOUS | Status: DC | PRN
  Administered 2022-04-19: 100 mg via INTRAVENOUS

## 2022-04-19 MED ORDER — SODIUM CHLORIDE 0.9 % IV SOLN
INTRAVENOUS | Status: DC
  Administered 2022-04-19: 20 mL/h via INTRAVENOUS

## 2022-04-19 MED ORDER — PROPOFOL 1000 MG/100ML IV EMUL
INTRAVENOUS | Status: AC
  Filled 2022-04-19: qty 400

## 2022-04-19 NOTE — H&P (Signed)
Outpatient short stay form Pre-procedure 04/19/2022 8:13 AM Victoria Flynn K. [name], M.D.  Primary Physician: Ramonita Lab III, M.D.  Reason for visit:  Personal history of adenomatous colon polyps  History of present illness:                            Patient presents for colonoscopy for a personal hx of colon polyps. The patient denies abdominal pain, abnormal weight loss or rectal bleeding.      Current Facility-Administered Medications:    0.9 %  sodium chloride infusion, , Intravenous, Continuous, [place], Benay Pike, MD, Last Rate: 20 mL/hr at 04/19/22 0729, 20 mL/hr at 04/19/22 0729  Medications Prior to Admission  Medication Sig Dispense Refill Last Dose   acetaminophen (TYLENOL) 650 MG CR tablet Take 1,300 mg by mouth 2 (two) times a day.   Past Week   amLODipine (NORVASC) 5 MG tablet Take 5 mg by mouth daily.   04/18/2022   Apremilast (OTEZLA) 30 MG TABS Take 1 tablet (30 mg total) by mouth 2 (two) times daily. 180 tablet 1 Past Week   Aspirin-Salicylamide-Caffeine (BC HEADACHE POWDER PO) Take 1 packet by mouth daily as needed (headache).   Past Week   betamethasone dipropionate 0.05 % cream Apply topically 2 (two) times daily as needed (Rash). 45 g 0 Past Week   calcium-vitamin D (OSCAL WITH D) 500-200 MG-UNIT per tablet Take 1 tablet by mouth 2 (two) times daily.   Past Week   carvedilol (COREG) 3.125 MG tablet Take 3.125 mg by mouth 2 (two) times daily.   04/18/2022   citalopram (CELEXA) 10 MG tablet Take 10 mg by mouth every morning.   Past Week   clobetasol ointment (TEMOVATE) [id number] % Apply 1 application topically 2 (two) times a week.   Past Week   donepezil (ARICEPT) 5 MG tablet Take 5 mg by mouth daily with breakfast.   04/18/2022   hydrochlorothiazide (MICROZIDE) 12.5 MG capsule Take 12.5 mg by mouth daily.    04/18/2022   hydrocortisone 2.5 % cream Apply topically 3 (three) times a week. Apply to rash in groin 3 nights weekly, Tuesday, Thursday, Saturday prn flares 60 g 3 Past Week    levothyroxine (SYNTHROID, LEVOTHROID) 137 MCG tablet Take 137 mcg by mouth every morning.    Past Week   losartan (COZAAR) 50 MG tablet Take 50 mg by mouth daily.    04/18/2022   MELATONIN MAXIMUM STRENGTH PO Take 5-15 mg by mouth at bedtime.    Past Week   Omega-3 Fatty Acids (FISH OIL PO) Take 950 mg by mouth daily.    Past Week   Tapinarof (VTAMA) 1 % CREA Apply 1 application topically daily. 60 g 2 Past Week   traZODone (DESYREL) 50 MG tablet Take 50 mg by mouth daily.    Past Week   LORazepam (ATIVAN) 0.5 MG tablet Take 1 mg by mouth at bedtime. For anxiety. (Patient not taking: Reported on 04/19/2022)   Not Taking   memantine (NAMENDA) 5 MG tablet Take by mouth.      potassium chloride (K-DUR) 10 MEQ tablet Take 1 tablet by mouth 2 (two) times a day.        Allergies  Allergen Reactions   Morphine And Related Nausea And Vomiting     Past Medical History:  Diagnosis Date   Acquired underactive thyroid    Anxiety    Arthritis    Cancer ([place])    skin  Arms and chest   Complication of anesthesia    dry  heaves   COPD (chronic obstructive pulmonary disease) (HCC)    mild   Depression    Dysrhythmia    tachyarrythmias   Headache    Hypertension    MRSA (methicillin resistant Staphylococcus aureus) colonization    PONV (postoperative nausea and vomiting)    Psoriasis    Sleep apnea    cpap   Squamous cell carcinoma of skin 07/29/2020   left popliteal    Review of systems:  Otherwise negative.    Physical Exam  Gen: Alert, oriented. Appears stated age.  HEENT: [place]/AT. PERRLA. Lungs: CTA, no wheezes. CV: RR nl S1, S2. Abd: soft, benign, no masses. BS+ Ext: No edema. Pulses 2+    Planned procedures: Proceed with colonoscopy. The patient understands the nature of the planned procedure, indications, risks, alternatives and potential complications including but not limited to bleeding, infection, perforation, damage to internal organs and possible oversedation/side  effects from anesthesia. The patient agrees and gives consent to proceed.  Please refer to procedure notes for findings, recommendations and patient disposition/instructions.     [name] K. [name], M.D. Gastroenterology 04/19/2022  8:13 AM

## 2022-04-19 NOTE — Op Note (Signed)
[facility] Gastroenterology Patient Name: Victoria Flynn Procedure Date: 04/19/2022 7:11 AM MRN: [id number] Account #: [id number] Date of Birth: Apr 25, 1945 Admit Type: Outpatient Age: 77 Room: [facility] ENDO ROOM 2 Gender: Female Note Status: Finalized Instrument Name: Park Meo [id number] Procedure:             Colonoscopy Indications:           Surveillance: Personal history of adenomatous polyps                         on last colonoscopy > 5 years ago Providers:             Lorie Apley K. [name] MD, MD Referring MD:          Ramonita Lab, MD (Referring MD) Medicines:             Propofol per Anesthesia Complications:         No immediate complications. Procedure:             Pre-Anesthesia Assessment:                        - The risks and benefits of the procedure and the                         sedation options and risks were discussed with the                         patient. All questions were answered and informed                         consent was obtained.                        - Patient identification and proposed procedure were                         verified prior to the procedure by the nurse. The                         procedure was verified in the procedure room.                        - ASA Grade Assessment: III - A patient with severe                         systemic disease.                        - After reviewing the risks and benefits, the patient                         was deemed in satisfactory condition to undergo the                         procedure.                        After obtaining informed consent, the colonoscope was  passed under direct vision. Throughout the procedure,                         the patient's blood pressure, pulse, and oxygen                         saturations were monitored continuously. The                         Colonoscope was introduced through the anus and                         advanced to  the the cecum, identified by appendiceal                         orifice and ileocecal valve. The colonoscopy was                         performed without difficulty. The patient tolerated                         the procedure well. The quality of the bowel                         preparation was adequate. The ileocecal valve,                         appendiceal orifice, and rectum were photographed. Findings:      The perianal and digital rectal examinations were normal. Pertinent       negatives include normal sphincter tone and no palpable rectal lesions.      Non-bleeding internal hemorrhoids were found during retroflexion. The       hemorrhoids were Grade I (internal hemorrhoids that do not prolapse).      Many small-mouthed diverticula were found in the sigmoid colon. There       was no evidence of diverticular bleeding.      The exam was otherwise without abnormality. Impression:            - Non-bleeding internal hemorrhoids.                        - Mild diverticulosis in the sigmoid colon. There was                         no evidence of diverticular bleeding.                        - The examination was otherwise normal.                        - No specimens collected. Recommendation:        - Patient has a contact number available for                         emergencies. The signs and symptoms of potential                         delayed complications were discussed with the patient.  Return to normal activities tomorrow. Written                         discharge instructions were provided to the patient.                        - Resume previous diet.                        - Continue present medications.                        - No repeat colonoscopy due to current age (74 years                         or older) and the absence of colonic polyps.                        - You do NOT require further colon cancer screening                          measures (Annual stool testing (i.e. hemoccult, FIT,                         cologuard), sigmoidoscopy, colonoscopy or CT                         colonography). You should share this recommendation                         with your Primary Care provider.                        - Return to GI office PRN.                        - The findings and recommendations were discussed with                         the patient. Procedure Code(s):     --- Professional ---                        [id number], Colorectal cancer screening; colonoscopy on                         individual at high risk Diagnosis Code(s):     --- Professional ---                        K57.30, Diverticulosis of large intestine without                         perforation or abscess without bleeding                        K64.0, First degree hemorrhoids                        Z86.010, Personal history of colonic polyps CPT copyright 2022 American Medical Association. All rights reserved. The codes documented in this report are preliminary  and upon coder review may  be revised to meet current compliance requirements. Efrain Sella MD, MD 04/19/2022 8:34:28 AM This report has been signed electronically. Number of Addenda: 0 Note Initiated On: 04/19/2022 7:11 AM Scope Withdrawal Time: 0 hours 6 minutes 17 seconds  Total Procedure Duration: 0 hours 10 minutes 19 seconds  Estimated Blood Loss:  Estimated blood loss: none.      [facility]

## 2022-04-19 NOTE — Anesthesia Postprocedure Evaluation (Signed)
Anesthesia Post Note  Patient: Victoria Flynn  Procedure(s) Performed: COLONOSCOPY  Patient location during evaluation: Endoscopy Anesthesia Type: General Level of consciousness: awake and alert Pain management: pain level controlled Vital Signs Assessment: post-procedure vital signs reviewed and stable Respiratory status: spontaneous breathing, nonlabored ventilation, respiratory function stable and patient connected to nasal cannula oxygen Cardiovascular status: blood pressure returned to baseline and stable Postop Assessment: no apparent nausea or vomiting Anesthetic complications: no  No notable events documented.   Last Vitals:  Vitals:   04/19/22 0715 04/19/22 0839  BP: (!) 159/89 122/61  Pulse:  70  Resp: 20 19  Temp: (!) 35.7 C (!) 35.8 C  SpO2:  99%    Last Pain:  Vitals:   04/19/22 0839  TempSrc: Temporal  PainSc: [place]

## 2022-04-19 NOTE — Anesthesia Procedure Notes (Signed)
Procedure Name: General with mask airway Date/Time: 04/19/2022 8:23 AM  Performed by: Kelton Pillar, CRNAPre-anesthesia Checklist: Patient identified, Emergency Drugs available, Suction available and Patient being monitored Patient Re-evaluated:Patient Re-evaluated prior to induction Oxygen Delivery Method: Simple face mask Induction Type: IV induction Placement Confirmation: positive ETCO2, CO2 detector and breath sounds checked- equal and bilateral Dental Injury: Teeth and Oropharynx as per pre-operative assessment

## 2022-04-19 NOTE — Interval H&P Note (Signed)
History and Physical Interval Note:  04/19/2022 8:13 AM  Victoria Flynn  has presented today for surgery, with the diagnosis of Personal history of colonic polyps (Z86.010).  The various methods of treatment have been discussed with the patient and family. After consideration of risks, benefits and other options for treatment, the patient has consented to  Procedure(s): COLONOSCOPY (N/A) as a surgical intervention.  The patient's history has been reviewed, patient examined, no change in status, stable for surgery.  I have reviewed the patient's chart and labs.  Questions were answered to the patient's satisfaction.     [place], [place]

## 2022-04-19 NOTE — Transfer of Care (Signed)
Immediate Anesthesia Transfer of Care Note  Patient: Victoria Flynn  Procedure(s) Performed: COLONOSCOPY  Patient Location: Endoscopy Unit  Anesthesia Type:General  Level of Consciousness: awake, drowsy, and patient cooperative  Airway & Oxygen Therapy: Patient Spontanous Breathing and Patient connected to face mask oxygen  Post-op Assessment: Report given to RN and Post -op Vital signs reviewed and stable  Post vital signs: Reviewed and stable  Last Vitals:  Vitals Value Taken Time  BP    Temp    Pulse    Resp    SpO2      Last Pain:  Vitals:   04/19/22 0715  TempSrc: Temporal  PainSc: 0-No pain         Complications: No notable events documented.

## 2022-04-19 NOTE — Anesthesia Preprocedure Evaluation (Signed)
Anesthesia Evaluation  Patient identified by MRN, date of birth, ID band Patient awake    Reviewed: Allergy & Precautions, NPO status , Patient's Chart, lab work & pertinent test results  History of Anesthesia Complications Negative for: history of anesthetic complications  Airway Mallampati: III  TM Distance: >3 FB Neck ROM: full    Dental  (+) Chipped, Dental Advidsory Given   Pulmonary neg shortness of breath, sleep apnea and Continuous Positive Airway Pressure Ventilation , COPD   Pulmonary exam normal        Cardiovascular hypertension, (-) angina (-) Past MI and (-) CABG negative cardio ROS Normal cardiovascular exam     Neuro/Psych  PSYCHIATRIC DISORDERS      negative neurological ROS     GI/Hepatic negative GI ROS, Neg liver ROS,,,  Endo/Other  Hypothyroidism    Renal/GU negative Renal ROS  negative genitourinary   Musculoskeletal   Abdominal   Peds  Hematology negative hematology ROS (+)   Anesthesia Other Findings Past Medical History: No date: Acquired underactive thyroid No date: Anxiety No date: Arthritis No date: Cancer ([place])     Comment:  skin   Arms and chest No date: Complication of anesthesia     Comment:  dry  heaves No date: COPD (chronic obstructive pulmonary disease) (HCC)     Comment:  mild No date: Depression No date: Dysrhythmia     Comment:  tachyarrythmias No date: Headache No date: Hypertension No date: MRSA (methicillin resistant Staphylococcus aureus)  colonization No date: PONV (postoperative nausea and vomiting) No date: Psoriasis No date: Sleep apnea     Comment:  cpap 07/29/2020: Squamous cell carcinoma of skin     Comment:  left popliteal  Past Surgical History: No date: BREAST EXCISIONAL BIOPSY; Left     Comment:  neg 1970's 1970: BREAST SURGERY; Left     Comment:  cyst removed No date: CHOLECYSTECTOMY 08/12/2018: COLONOSCOPY WITH PROPOFOL; N/A     Comment:   Procedure: COLONOSCOPY WITH PROPOFOL;  Surgeon: Manya Silvas, MD;  Location: [facility] ENDOSCOPY;  Service:               Endoscopy;  Laterality: N/A; No date: EYE SURGERY; Bilateral     Comment:  cataract 2005: JOINT REPLACEMENT; Left     Comment:  hip 2008: JOINT REPLACEMENT; Right     Comment:  hip 2013: JOINT REPLACEMENT; Right     Comment:  knee 09/20/2018: KNEE ARTHROPLASTY; Left     Comment:  Procedure: COMPUTER ASSISTED TOTAL KNEE ARTHROPLASTY -               RNFA;  Surgeon: Dereck Leep, MD;  Location: [facility] ORS;              Service: Orthopedics;  Laterality: Left;  BMI    Body Mass Index: 36.47 kg/m      Reproductive/Obstetrics negative OB ROS                             Anesthesia Physical Anesthesia Plan  ASA: 3  Anesthesia Plan: General   Post-op Pain Management: Minimal or no pain anticipated   Induction: Intravenous  PONV Risk Score and Plan: 3 and Propofol infusion, TIVA and Ondansetron  Airway Management Planned: Nasal Cannula  Additional Equipment: None  Intra-op Plan:   Post-operative Plan:   Informed Consent: I have reviewed the  patients History and Physical, chart, labs and discussed the procedure including the risks, benefits and alternatives for the proposed anesthesia with the patient or authorized representative who has indicated his/her understanding and acceptance.     Dental advisory given  Plan Discussed with: CRNA and Surgeon  Anesthesia Plan Comments: (Discussed risks of anesthesia with patient, including possibility of difficulty with spontaneous ventilation under anesthesia necessitating airway intervention, PONV, and rare risks such as cardiac or respiratory or neurological events, and allergic reactions. Discussed the role of CRNA in patient's perioperative care. Patient understands.)       Anesthesia Quick Evaluation

## 2022-04-20 ENCOUNTER — Other Ambulatory Visit: Payer: Self-pay | Admitting: Internal Medicine

## 2022-04-20 ENCOUNTER — Encounter: Payer: Self-pay | Admitting: Internal Medicine

## 2022-04-20 DIAGNOSIS — Z1231 Encounter for screening mammogram for malignant neoplasm of breast: Secondary | ICD-10-CM

## 2022-04-25 ENCOUNTER — Other Ambulatory Visit: Payer: Self-pay | Admitting: Family Medicine

## 2022-04-25 DIAGNOSIS — M5416 Radiculopathy, lumbar region: Secondary | ICD-10-CM

## 2022-04-29 ENCOUNTER — Ambulatory Visit
Admission: RE | Admit: 2022-04-29 | Discharge: 2022-04-29 | Disposition: A | Discharge status: Home / Self Care | Payer: Medicare PPO | Source: Ambulatory Visit | Attending: Family Medicine | Admitting: Family Medicine

## 2022-04-29 DIAGNOSIS — M5416 Radiculopathy, lumbar region: Secondary | ICD-10-CM | POA: Insufficient documentation

## 2022-05-11 ENCOUNTER — Ambulatory Visit
Admission: RE | Admit: 2022-05-11 | Discharge: 2022-05-11 | Disposition: A | Discharge status: Home / Self Care | Payer: Medicare PPO | Source: Ambulatory Visit | Attending: Internal Medicine | Admitting: Internal Medicine

## 2022-05-11 DIAGNOSIS — Z1231 Encounter for screening mammogram for malignant neoplasm of breast: Secondary | ICD-10-CM

## 2022-05-12 ENCOUNTER — Emergency Department
Admission: EM | Admit: 2022-05-12 | Discharge: 2022-05-12 | Disposition: A | Discharge status: Home / Self Care | Payer: Medicare PPO | Attending: Emergency Medicine | Admitting: Emergency Medicine

## 2022-05-12 ENCOUNTER — Other Ambulatory Visit: Payer: Self-pay

## 2022-05-12 ENCOUNTER — Emergency Department: Payer: Medicare PPO

## 2022-05-12 DIAGNOSIS — S65201A Unspecified injury of superficial palmar arch of right hand, initial encounter: Secondary | ICD-10-CM | POA: Diagnosis not present

## 2022-05-12 DIAGNOSIS — M79644 Pain in right finger(s): Secondary | ICD-10-CM | POA: Diagnosis not present

## 2022-05-12 DIAGNOSIS — W231XXA Caught, crushed, jammed, or pinched between stationary objects, initial encounter: Secondary | ICD-10-CM | POA: Insufficient documentation

## 2022-05-12 DIAGNOSIS — S67190A Crushing injury of right index finger, initial encounter: Secondary | ICD-10-CM | POA: Diagnosis present

## 2022-05-12 NOTE — ED Provider Notes (Signed)
[facility] Emergency Department Provider Note     Event Date/Time   First MD Initiated Contact with Patient 05/12/22 1114     (approximate)   History   Laceration   HPI  Victoria Flynn is a 77 y.o. female to the ED for evaluation of a crush injury to her index finger on the right.  Injury occurred yesterday morning after she closed her finger in the door.  Patient presents to the ED with some swelling to the finger fat pad, and a superficial laceration to the fingerprint skin.   Physical Exam   Triage Vital Signs: ED Triage Vitals  Enc Vitals Group     BP 05/12/22 1001 (!) 159/91     Pulse Rate 05/12/22 0959 67     Resp 05/12/22 0959 18     Temp 05/12/22 0959 97.6 F (36.4 C)     Temp Source 05/12/22 0959 Oral     SpO2 05/12/22 0959 94 %     Weight --      Height --      Head Circumference --      Peak Flow --      Pain Score 05/12/22 1000 0     Pain Loc --      Pain Edu? --      Excl. in [place]? --     Most recent vital signs: Vitals:   05/12/22 1001 05/12/22 1147  BP: (!) 159/91 (!) 141/74  Pulse:  (!) 59  Resp:  16  Temp:    SpO2:  95%    General Awake, no distress. NAD CV:  Good peripheral perfusion.  RESP:  Normal effort.  ABD:  No distention.  MSK:  Normal composite fist on the right.  Right index finger with evidence of some mild soft tissue swelling and ecchymosis to the fat pad.  No evidence of a felon, nail avulsion, or paronychia.  Superficial laceration to the palmar fat pad without ongoing bleeding at this time.   ED Results / Procedures / Treatments   Labs (all labs ordered are listed, but only abnormal results are displayed) Labs Reviewed - No data to display   EKG   RADIOLOGY  I personally viewed and evaluated these images as part of my medical decision making, as well as reviewing the written report by the radiologist.  ED Provider Interpretation: No acute findings  DG Finger Index Right  Result Date:  05/12/2022 CLINICAL DATA:  Slammed in car door yesterday. EXAM: RIGHT INDEX FINGER 2+V COMPARISON:  None Available. FINDINGS: Mildly decreased bone mineralization. Moderate to severe index finger DIP joint space narrowing with mild-to-moderate dorsal degenerative osteophytes. Mild index finger PIP joint space narrowing and peripheral osteophytosis. Severe thumb carpometacarpal joint space narrowing, subchondral sclerosis/cystic change, and peripheral osteophytosis. Moderate triscaphe joint space narrowing. Moderate thumb metacarpophalangeal joint space narrowing, greatest laterally. Chronic appearing mineralization at the dorsal lateral aspect of the base of the proximal phalanx of the thumb. IMPRESSION: 1. No acute fracture is seen. 2. Severe thumb carpometacarpal joint osteoarthritis. 3. Moderate to severe index finger DIP osteoarthritis. Electronically Signed   By: Yvonne Kendall M.D.   On: 05/12/2022 11:43     PROCEDURES:  Critical Care performed: No  Procedures   MEDICATIONS ORDERED IN ED: Medications - No data to display   IMPRESSION / MDM / [place] / ED COURSE  I reviewed the triage vital signs and the nursing notes.  Differential diagnosis includes, but is not limited to, tuft fracture, nail avulsion, contusion, ankle sprain  Patient's presentation is most consistent with acute complicated illness / injury requiring diagnostic workup.  Patient's diagnosis is consistent with crush injury to the right index finger, resulting in a superficial laceration to the palmar aspect.  No radiologic evidence of any acute fracture or dislocation.  No nailbed avulsion noted.  Wound is cleansed and dressed with nonstick bandage.  Patient is given a finger splint for protection. Patient will be discharged home with wound care instructions. Patient is to follow up with primary provider as needed or otherwise directed. Patient is given ED precautions to return to  the ED for any worsening or new symptoms.  FINAL CLINICAL IMPRESSION(S) / ED DIAGNOSES   Final diagnoses:  Crushing injury of right index finger, initial encounter     Rx / DC Orders   ED Discharge Orders     None        Note:  This document was prepared using Dragon voice recognition software and may include unintentional dictation errors.    Melvenia Needles, PA-C 05/12/22 2010    Carrie Mew, MD 05/23/22 [phone]

## 2022-05-12 NOTE — ED Triage Notes (Signed)
Pt to ED via POV from home. Pt reports yesterday she slammed her right pointer finger in the car door. Pt states bleeding has not stopped. Pt has finger wrapped. Pt denies blood thinners. Pt in NAD and ambulatory to triage.

## 2022-05-12 NOTE — Discharge Instructions (Signed)
Your exam and x-ray is normal and reassuring at this time but no signs of a bony injury to your fingertip.  Keep the superficial wound clean, dry, and covered.  Use the finger splint as needed.

## 2022-06-10 IMAGING — CT CT HEAD W/O CM
3 series · 14 of 47 positions shown, 16 images · non-contrast
Comparison: Brain MRI 07/03/2018

CLINICAL DATA: Injury due to fall, initial encounter. Injury of
head, initial encounter. Additional provided by scanning
technologist: Fall 3 days ago striking right frontotemporal area, no
reported loss of consciousness, headache.

EXAM:
CT HEAD WITHOUT CONTRAST
TECHNIQUE: Contiguous axial images were obtained from the base of the skull
through the vertex without intravenous contrast.

[Series 2: head wo · axial · 0.47mm/px · z∈[-187,-57]mm · 8 of 32 slices shown, 10 images]
[im 3/32  brain]
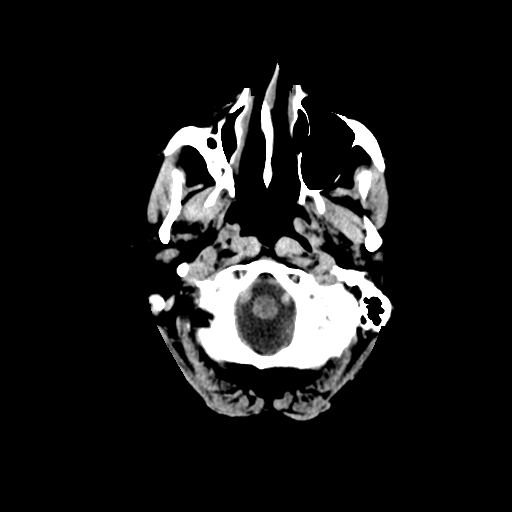
[im 3/32  bone]
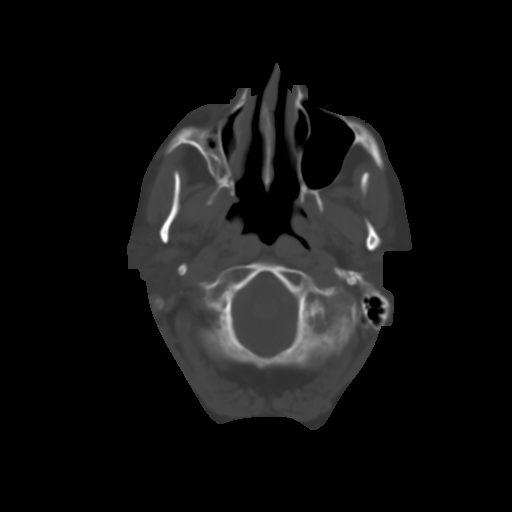
[im 7/32  brain]
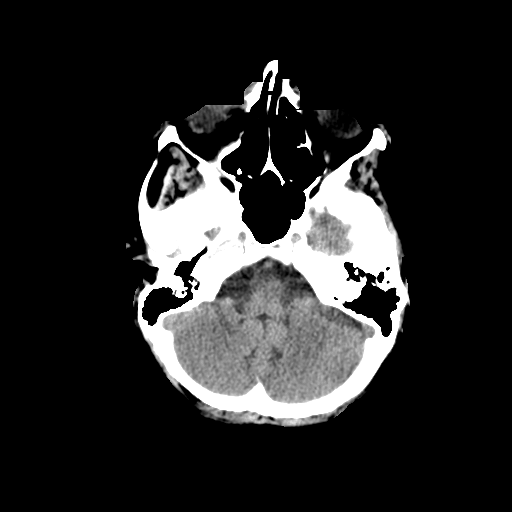
[im 10/32  brain]
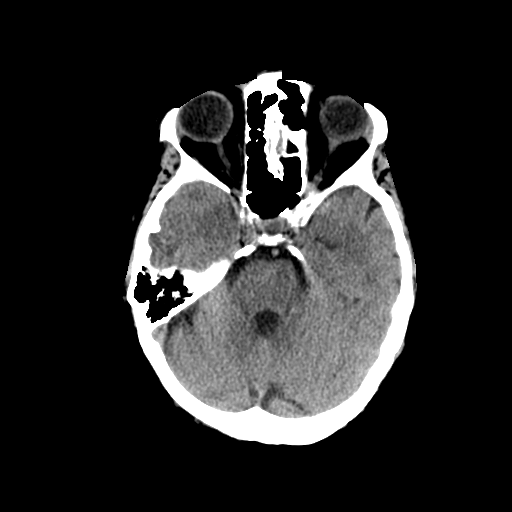
[im 14/32  brain]
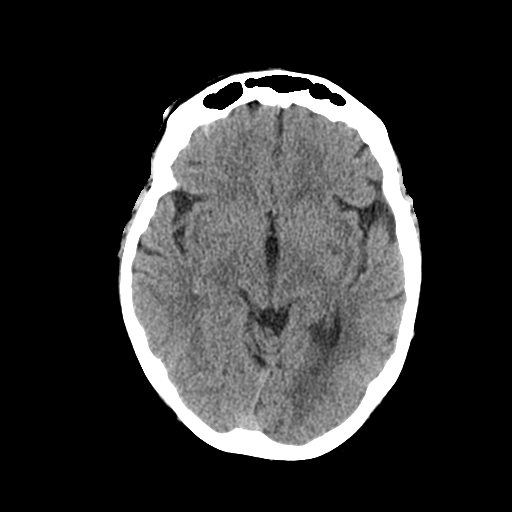
[im 18/32  brain]
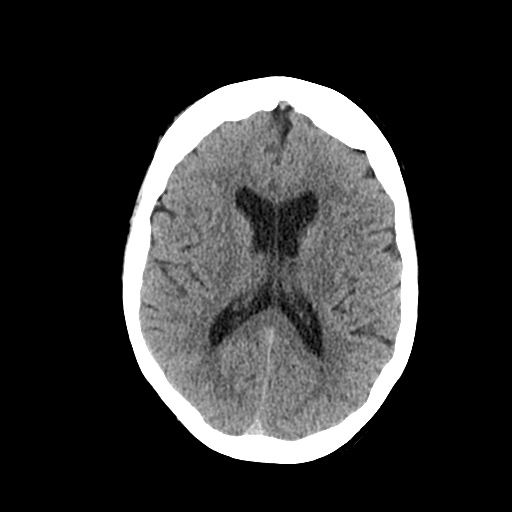
[im 18/32  bone]
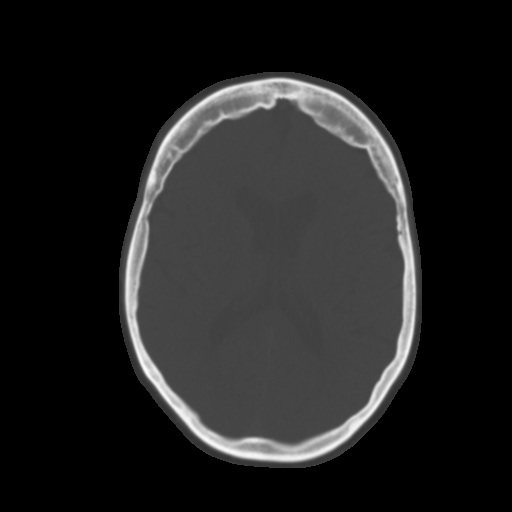
[im 22/32  brain]
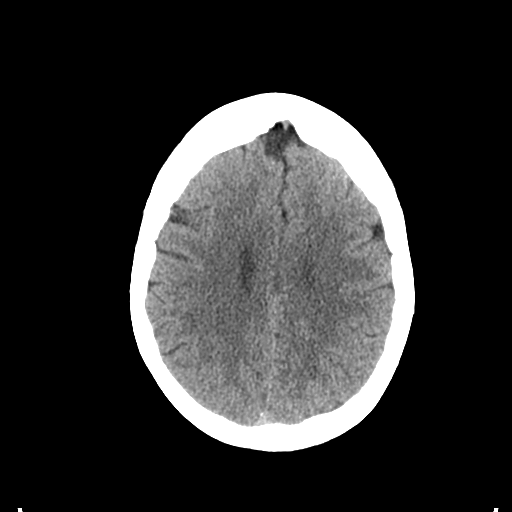
[im 25/32  brain]
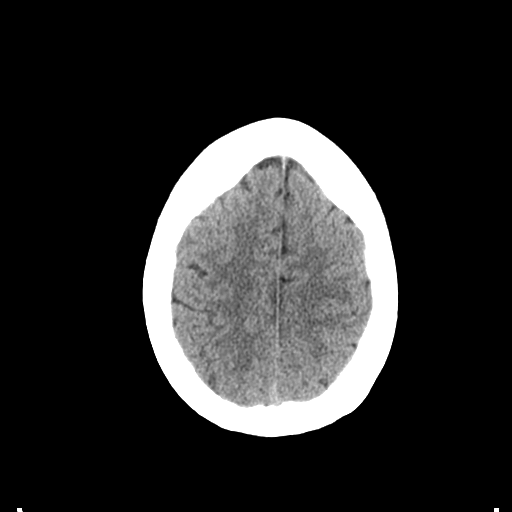
[im 29/32  brain]
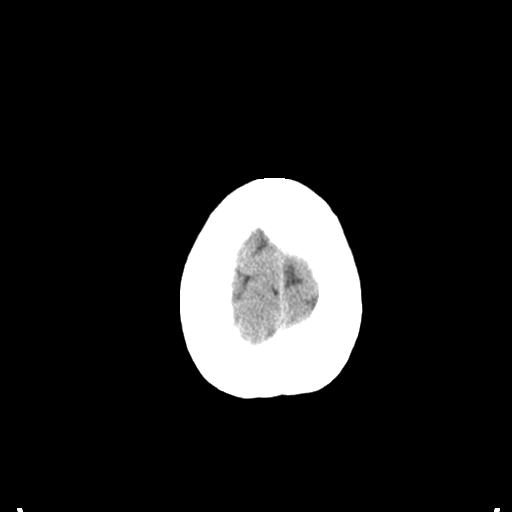

[Series 4: coronal soft tissue · coronal · 0.29mm/px · 3 of 67 slices shown]
[im 23/67  brain]
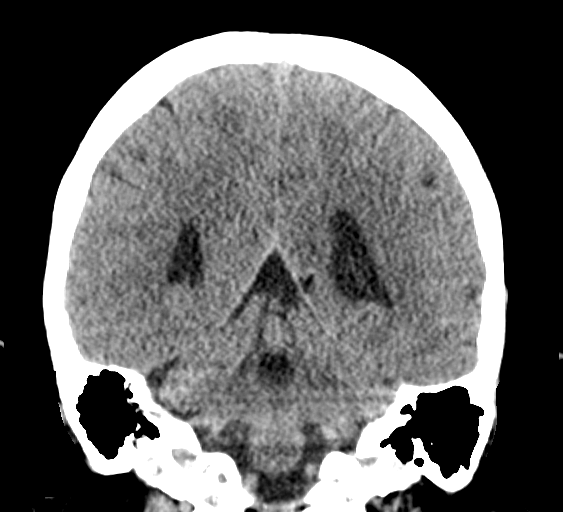
[im 30/67  brain]
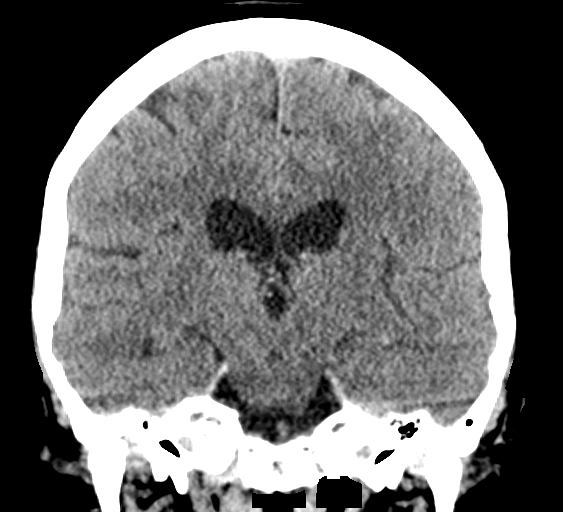
[im 37/67  brain]
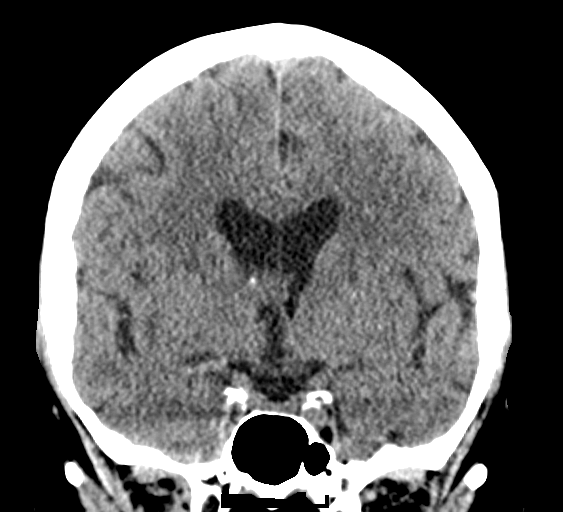

[Series 5: sagittal soft tissue · sagittal · 0.29mm/px · 3 of 55 slices shown]
[im 19/55  brain]
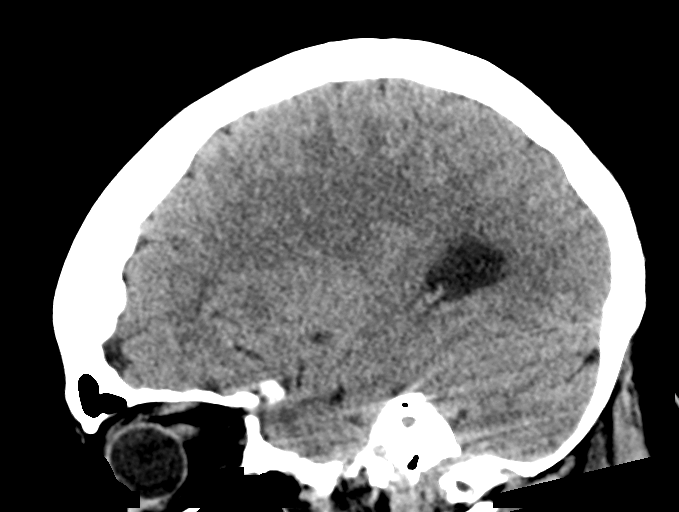
[im 28/55  brain]
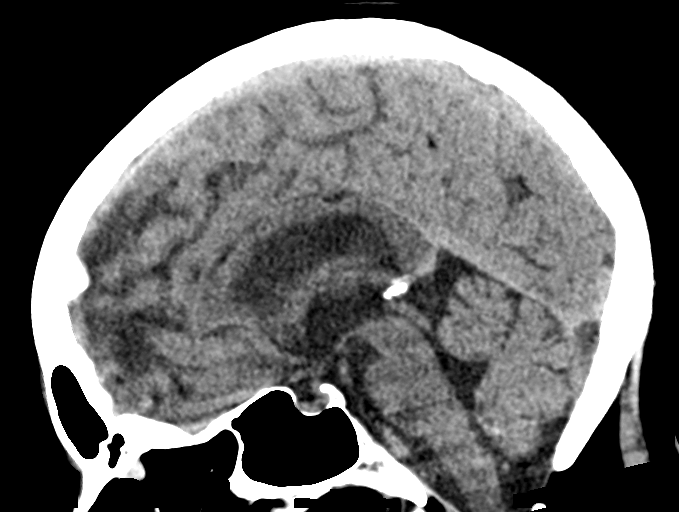
[im 37/55  brain]
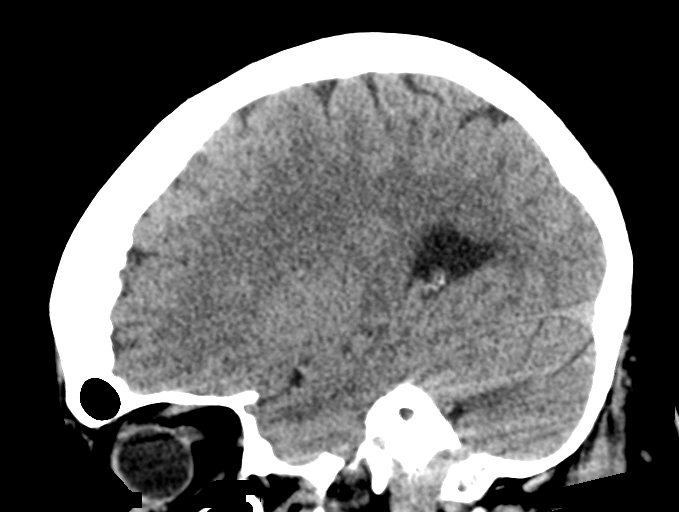

[14 of 47 positions shown; findings below may reference images not displayed]

FINDINGS: Brain:

Cerebral volume is normal for age.

Mild ill-defined hypoattenuation within the cerebral white matter
which is nonspecific, but consistent with chronic small vessel
ischemic disease.

Redemonstrated 1 cm calcified extra-axial focus within the
paramedian right frontal region, which may reflect a small
meningioma (series 3, image 50). An additional 9 mm partially
calcified meningioma along the left sphenoid wing is unchanged
(series 3, image 26). In retrospect this was present on the prior
examination.

There is no acute intracranial hemorrhage.

No demarcated cortical infarct.

No extra-axial fluid collection.

No midline shift.

Vascular: No hyperdense vessel.  Atherosclerotic calcifications.

Skull: No calvarial fracture.  Hyperostosis frontalis interna.

Sinuses/Orbits: Visualized orbits show no acute finding. Mild
mucosal thickening and frothy secretions within a hypoplastic right
maxillary sinus. No significant mastoid effusion

Other: Right frontotemporal scalp and right periorbital soft tissue
hematoma.
IMPRESSION: No evidence of acute intracranial abnormality. Right frontotemporal
scalp and right periorbital hematoma.

Unchanged 9 mm partially calcified meningioma along the left
sphenoid wing.

A possible additional 1 cm calcified meningioma within the
paramedian right frontal region is also unchanged.

Stable, mild chronic small vessel ischemic disease.

Mild mucosal thickening and frothy secretions within a hypoplastic
right maxillary sinus. Correlate for acute sinusitis.

## 2022-09-12 ENCOUNTER — Ambulatory Visit
Admission: RE | Admit: 2022-09-12 | Discharge: 2022-09-12 | Disposition: A | Discharge status: Home / Self Care | Payer: Medicare PPO | Source: Ambulatory Visit | Attending: Internal Medicine | Admitting: Internal Medicine

## 2022-09-12 ENCOUNTER — Other Ambulatory Visit: Payer: Self-pay

## 2022-09-12 ENCOUNTER — Other Ambulatory Visit: Payer: Self-pay | Admitting: Internal Medicine

## 2022-09-12 ENCOUNTER — Inpatient Hospital Stay
Admission: EM | Admit: 2022-09-12 | Discharge: 2022-09-17 | DRG: 438 | Disposition: A | Discharge status: Other Acute Inpatient Hospital | Payer: Medicare PPO | Attending: Internal Medicine | Admitting: Internal Medicine

## 2022-09-12 DIAGNOSIS — E440 Moderate protein-calorie malnutrition: Secondary | ICD-10-CM | POA: Diagnosis present

## 2022-09-12 DIAGNOSIS — F3342 Major depressive disorder, recurrent, in full remission: Secondary | ICD-10-CM | POA: Diagnosis present

## 2022-09-12 DIAGNOSIS — R17 Unspecified jaundice: Secondary | ICD-10-CM | POA: Insufficient documentation

## 2022-09-12 DIAGNOSIS — K8689 Other specified diseases of pancreas: Principal | ICD-10-CM | POA: Diagnosis present

## 2022-09-12 DIAGNOSIS — K831 Obstruction of bile duct: Secondary | ICD-10-CM | POA: Diagnosis present

## 2022-09-12 DIAGNOSIS — R7989 Other specified abnormal findings of blood chemistry: Secondary | ICD-10-CM | POA: Insufficient documentation

## 2022-09-12 DIAGNOSIS — Z96659 Presence of unspecified artificial knee joint: Secondary | ICD-10-CM

## 2022-09-12 DIAGNOSIS — Z8719 Personal history of other diseases of the digestive system: Secondary | ICD-10-CM

## 2022-09-12 DIAGNOSIS — R748 Abnormal levels of other serum enzymes: Secondary | ICD-10-CM | POA: Diagnosis present

## 2022-09-12 DIAGNOSIS — G4700 Insomnia, unspecified: Secondary | ICD-10-CM | POA: Diagnosis present

## 2022-09-12 DIAGNOSIS — I10 Essential (primary) hypertension: Secondary | ICD-10-CM | POA: Diagnosis present

## 2022-09-12 DIAGNOSIS — F419 Anxiety disorder, unspecified: Secondary | ICD-10-CM | POA: Diagnosis present

## 2022-09-12 DIAGNOSIS — C259 Malignant neoplasm of pancreas, unspecified: Secondary | ICD-10-CM | POA: Diagnosis present

## 2022-09-12 DIAGNOSIS — J449 Chronic obstructive pulmonary disease, unspecified: Secondary | ICD-10-CM | POA: Diagnosis present

## 2022-09-12 DIAGNOSIS — R1084 Generalized abdominal pain: Secondary | ICD-10-CM

## 2022-09-12 DIAGNOSIS — D849 Immunodeficiency, unspecified: Secondary | ICD-10-CM | POA: Diagnosis present

## 2022-09-12 DIAGNOSIS — Z96643 Presence of artificial hip joint, bilateral: Secondary | ICD-10-CM | POA: Diagnosis present

## 2022-09-12 DIAGNOSIS — Z6834 Body mass index (BMI) 34.0-34.9, adult: Secondary | ICD-10-CM

## 2022-09-12 DIAGNOSIS — E876 Hypokalemia: Secondary | ICD-10-CM | POA: Diagnosis present

## 2022-09-12 DIAGNOSIS — Z85828 Personal history of other malignant neoplasm of skin: Secondary | ICD-10-CM

## 2022-09-12 DIAGNOSIS — Z66 Do not resuscitate: Secondary | ICD-10-CM | POA: Diagnosis present

## 2022-09-12 DIAGNOSIS — G4733 Obstructive sleep apnea (adult) (pediatric): Secondary | ICD-10-CM | POA: Diagnosis present

## 2022-09-12 DIAGNOSIS — R001 Bradycardia, unspecified: Secondary | ICD-10-CM | POA: Diagnosis present

## 2022-09-12 DIAGNOSIS — G4730 Sleep apnea, unspecified: Secondary | ICD-10-CM | POA: Diagnosis present

## 2022-09-12 DIAGNOSIS — Z96653 Presence of artificial knee joint, bilateral: Secondary | ICD-10-CM | POA: Diagnosis present

## 2022-09-12 DIAGNOSIS — E039 Hypothyroidism, unspecified: Secondary | ICD-10-CM | POA: Diagnosis present

## 2022-09-12 DIAGNOSIS — E785 Hyperlipidemia, unspecified: Secondary | ICD-10-CM | POA: Diagnosis present

## 2022-09-12 DIAGNOSIS — Z7982 Long term (current) use of aspirin: Secondary | ICD-10-CM

## 2022-09-12 DIAGNOSIS — Z79899 Other long term (current) drug therapy: Secondary | ICD-10-CM

## 2022-09-12 DIAGNOSIS — Z79890 Hormone replacement therapy (postmenopausal): Secondary | ICD-10-CM

## 2022-09-12 DIAGNOSIS — Z86010 Personal history of colonic polyps: Secondary | ICD-10-CM

## 2022-09-12 DIAGNOSIS — Z96652 Presence of left artificial knee joint: Secondary | ICD-10-CM | POA: Diagnosis present

## 2022-09-12 LAB — CBC WITH DIFFERENTIAL/PLATELET
Abs Immature Granulocytes: 0.03 10*3/uL (ref 0.00–0.07)
Basophils Absolute: 0.1 10*3/uL (ref 0.0–0.1)
Basophils Relative: 1 %
Eosinophils Absolute: 0.2 10*3/uL (ref 0.0–0.5)
Eosinophils Relative: 2 %
HCT: 33.3 % — ABNORMAL LOW (ref 36.0–46.0)
Hemoglobin: 11 g/dL — ABNORMAL LOW (ref 12.0–15.0)
Immature Granulocytes: 0 %
Lymphocytes Relative: 17 %
Lymphs Abs: 1.7 10*3/uL (ref 0.7–4.0)
MCH: 30.1 pg (ref 26.0–34.0)
MCHC: 33 g/dL (ref 30.0–36.0)
MCV: 91 fL (ref 80.0–100.0)
Monocytes Absolute: 0.7 10*3/uL (ref 0.1–1.0)
Monocytes Relative: 6 %
Neutro Abs: 7.6 10*3/uL (ref 1.7–7.7)
Neutrophils Relative %: 74 %
Platelets: 279 10*3/uL (ref 150–400)
RBC: 3.66 MIL/uL — ABNORMAL LOW (ref 3.87–5.11)
RDW: 17.2 % — ABNORMAL HIGH (ref 11.5–15.5)
WBC: 10.3 10*3/uL (ref 4.0–10.5)
nRBC: 0 % (ref 0.0–0.2)

## 2022-09-12 LAB — COMPREHENSIVE METABOLIC PANEL
ALT: 266 U/L — ABNORMAL HIGH (ref 0–44)
AST: 205 U/L — ABNORMAL HIGH (ref 15–41)
Albumin: 3.3 g/dL — ABNORMAL LOW (ref 3.5–5.0)
Alkaline Phosphatase: 315 U/L — ABNORMAL HIGH (ref 38–126)
Anion gap: 12 (ref 5–15)
BUN: 23 mg/dL (ref 8–23)
CO2: 22 mmol/L (ref 22–32)
Calcium: 8.8 mg/dL — ABNORMAL LOW (ref 8.9–10.3)
Chloride: 102 mmol/L (ref 98–111)
Creatinine, Ser: 1.1 mg/dL — ABNORMAL HIGH (ref 0.44–1.00)
GFR, Estimated: 51 mL/min — ABNORMAL LOW (ref >60)
Glucose, Bld: 124 mg/dL — ABNORMAL HIGH (ref 70–99)
Potassium: 3 mmol/L — ABNORMAL LOW (ref 3.5–5.1)
Sodium: 136 mmol/L (ref 135–145)
Total Bilirubin: 11.4 mg/dL — ABNORMAL HIGH (ref 0.3–1.2)
Total Protein: 7 g/dL (ref 6.5–8.1)

## 2022-09-12 LAB — PROTIME-INR
INR: 1.1 (ref 0.8–1.2)
Prothrombin Time: 14.3 seconds (ref 11.4–15.2)

## 2022-09-12 LAB — LIPASE, BLOOD: Lipase: 94 U/L — ABNORMAL HIGH (ref 11–51)

## 2022-09-12 LAB — TSH: TSH: 2.321 u[IU]/mL (ref 0.350–4.500)

## 2022-09-12 MED ORDER — LOSARTAN POTASSIUM 50 MG PO TABS
100.0000 mg | ORAL_TABLET | Freq: Every day | ORAL | Status: DC
  Administered 2022-09-13 – 2022-09-17 (×5): 100 mg via ORAL
  Filled 2022-09-12 (×5): qty 2

## 2022-09-12 MED ORDER — CARVEDILOL 6.25 MG PO TABS
6.2500 mg | ORAL_TABLET | Freq: Two times a day (BID) | ORAL | Status: DC
  Administered 2022-09-13 – 2022-09-17 (×10): 6.25 mg via ORAL
  Filled 2022-09-12 (×11): qty 1

## 2022-09-12 MED ORDER — IOHEXOL 300 MG/ML  SOLN
100.0000 mL | Freq: Once | INTRAMUSCULAR | Status: AC | PRN
  Administered 2022-09-12: 80 mL via INTRAVENOUS

## 2022-09-12 MED ORDER — TRAZODONE HCL 50 MG PO TABS: 50.0000 mg | ORAL_TABLET | Freq: Every day | ORAL | Status: DC

## 2022-09-12 MED ORDER — CITALOPRAM HYDROBROMIDE 20 MG PO TABS
20.0000 mg | ORAL_TABLET | Freq: Every day | ORAL | Status: DC
  Administered 2022-09-13 – 2022-09-17 (×5): 20 mg via ORAL
  Filled 2022-09-12 (×5): qty 1

## 2022-09-12 MED ORDER — MEMANTINE HCL 5 MG PO TABS
10.0000 mg | ORAL_TABLET | Freq: Two times a day (BID) | ORAL | Status: DC
  Administered 2022-09-13 – 2022-09-17 (×10): 10 mg via ORAL
  Filled 2022-09-12 (×10): qty 2

## 2022-09-12 MED ORDER — APREMILAST 30 MG PO TABS: 30.0000 mg | ORAL_TABLET | Freq: Two times a day (BID) | ORAL | Status: DC

## 2022-09-12 MED ORDER — LEVOTHYROXINE SODIUM 137 MCG PO TABS
137.0000 ug | ORAL_TABLET | Freq: Every day | ORAL | Status: DC
  Administered 2022-09-13 – 2022-09-17 (×5): 137 ug via ORAL
  Filled 2022-09-12 (×5): qty 1

## 2022-09-12 MED ORDER — AMLODIPINE BESYLATE 5 MG PO TABS
5.0000 mg | ORAL_TABLET | Freq: Every day | ORAL | Status: DC
  Administered 2022-09-13 – 2022-09-17 (×5): 5 mg via ORAL
  Filled 2022-09-12 (×5): qty 1

## 2022-09-12 NOTE — ED Provider Notes (Signed)
Received in sign out, patient awaiting transfer to ERCP capable hospitalist (likely [facility] whom has accepted patient).  ----------------------------------------- 8:03 AM on 09/13/2022 ----------------------------------------- Ongoing care assigned to Dr. Larinda Buttery, follow-up on pending transfer   Sharyn Creamer, MD 09/13/22 [phone]

## 2022-09-12 NOTE — ED Notes (Signed)
Pt connected to all monitoring devices

## 2022-09-12 NOTE — ED Notes (Signed)
called to [facility] Per MD Mumma/Consult to Transfer/Imaging [employer] faxed/rep:Joy

## 2022-09-12 NOTE — ED Triage Notes (Signed)
Patient to ED via POV. Patient was sent by Alberteen Spindle, MD regarding a concern that the patient is jaundiced with abnormal labs. Patient had CT and labs done today.

## 2022-09-12 NOTE — ED Provider Notes (Signed)
[facility] Provider Note    Event Date/Time   First MD Initiated Contact with Patient 09/12/22 1722     (approximate)   History   Jaundice   HPI  Victoria Flynn is a 77 y.o. female past medical history significant for anxiety, arthritis, COPD, presents to the emergency department with jaundice.  Patient states that she has had progressively worsening yellowing of her skin over the past couple of weeks.  Does endorse weight loss and generalized fatigue and nausea.  Does have some early satiety.  No night sweats.  No fever.  When evaluated today at her primary care physician office and had lab work and a CT scan done and was told to come to the emergency department.  Denies any significant alcohol use or drug use.  Prior cholecystectomy.     Physical Exam   Triage Vital Signs: ED Triage Vitals [09/12/22 1627]  Encounter Vitals Group     BP (!) 168/74     Systolic BP Percentile      Diastolic BP Percentile      Pulse Rate (!) 55     Resp 18     Temp 98.5 F (36.9 C)     Temp Source Oral     SpO2 95 %     Weight 181 lb (82.1 kg)     Height 5\' 1"  (1.549 m)     Head Circumference      Peak Flow      Pain Score 0     Pain Loc      Pain Education      Exclude from Growth Chart     Most recent vital signs: Vitals:   09/12/22 2300 09/12/22 2340  BP: (!) 180/75   Pulse: (!) 51 (!) 51  Resp: (!) 24 20  Temp:    SpO2: 96% 95%    Physical Exam Constitutional:      Appearance: She is well-developed.  HENT:     Head: Atraumatic.  Eyes:     Conjunctiva/sclera: Conjunctivae normal.  Cardiovascular:     Rate and Rhythm: Regular rhythm.  Pulmonary:     Effort: No respiratory distress.  Abdominal:     General: There is no distension.     Tenderness: There is no abdominal tenderness.  Musculoskeletal:        General: Normal range of motion.     Cervical back: Normal range of motion.  Skin:    Capillary Refill: Capillary refill takes less than 2  seconds.     Coloration: Skin is jaundiced.  Neurological:     Mental Status: She is alert. Mental status is at baseline.     IMPRESSION / MDM / ASSESSMENT AND PLAN / ED COURSE  I reviewed the triage vital signs and the nursing notes.  On chart review of outside records patient had an elevated T. bili today up to 11.  Had a CT scan done abdomen pelvis with contrast that has not yet been read.   Differential diagnosis including pancreatic cancer, malignancy, choledocholithiasis    RADIOLOGY I independently reviewed imaging, my interpretation of imaging: CT abdomen and pelvis with contrast done earlier today with intra and extrahepatic dilation.  Cholecystectomy.  Read as significant intra and extrahepatic dilation with concern for mass in the pancreas.  LABS (all labs ordered are listed, but only abnormal results are displayed) Labs interpreted as -    Labs Reviewed  CBC WITH DIFFERENTIAL/PLATELET - Abnormal; Notable for the  following components:      Result Value   RBC 3.66 (*)    Hemoglobin 11.0 (*)    HCT 33.3 (*)    RDW 17.2 (*)    All other components within normal limits  COMPREHENSIVE METABOLIC PANEL - Abnormal; Notable for the following components:   Potassium 3.0 (*)    Glucose, Bld 124 (*)    Creatinine, Ser 1.10 (*)    Calcium 8.8 (*)    Albumin 3.3 (*)    AST 205 (*)    ALT 266 (*)    Alkaline Phosphatase 315 (*)    Total Bilirubin 11.4 (*)    GFR, Estimated 51 (*)    All other components within normal limits  LIPASE, BLOOD - Abnormal; Notable for the following components:   Lipase 94 (*)    All other components within normal limits  PROTIME-INR  TSH     MDM  Called and discussed with radiology who was able to read CT scan done earlier today.  Concern for mass on the pancreas causing intra and extrahepatic dilation.  T. bili repeated and continues to be 11.  Mild elevation of LFTs and lipase.  Consulted gastroenterology and discussed with Dr.  Mia Creek who recommended transfer to tertiary care center for ERCP and further workup.  Recommended transfer to [facility] or [facility].  Called [facility] with no beds available.  Currently waiting on [facility] callback.  Accepted to [facility] by Dr. Ladona Ridgel with the hospitalist group for ERCP needed.  Home medications reordered while in the emergency department.    Hypertensive in the emergency department.  Reordered home medications.  No pain at this time.     PROCEDURES:  Critical Care performed: No  Procedures  Patient's presentation is most consistent with acute presentation with potential threat to life or bodily function.   MEDICATIONS ORDERED IN ED: Medications  amLODipine (NORVASC) tablet 5 mg (has no administration in time range)  Apremilast TABS 30 mg (has no administration in time range)  carvedilol (COREG) tablet 6.25 mg (has no administration in time range)  citalopram (CELEXA) tablet 20 mg (has no administration in time range)  levothyroxine (SYNTHROID) tablet 137 mcg (has no administration in time range)  losartan (COZAAR) tablet 100 mg (has no administration in time range)  memantine (NAMENDA) tablet 10 mg (has no administration in time range)  traZODone (DESYREL) tablet 50 mg (has no administration in time range)    FINAL CLINICAL IMPRESSION(S) / ED DIAGNOSES   Final diagnoses:  Painless jaundice  Serum total bilirubin elevated     Rx / DC Orders   ED Discharge Orders          Ordered    Diet regular        09/12/22 2001             Note:  This document was prepared using Dragon voice recognition software and may include unintentional dictation errors.   Corena Herter, MD 09/12/22 9604    Corena Herter, MD 09/13/22 5409

## 2022-09-12 NOTE — ED Notes (Signed)
called to [facility] per MD Mumma/Transfer/GI Medicine closed to medicine transfers per [id number].

## 2022-09-13 DIAGNOSIS — Z66 Do not resuscitate: Secondary | ICD-10-CM | POA: Diagnosis present

## 2022-09-13 DIAGNOSIS — Z8719 Personal history of other diseases of the digestive system: Secondary | ICD-10-CM | POA: Diagnosis not present

## 2022-09-13 DIAGNOSIS — J449 Chronic obstructive pulmonary disease, unspecified: Secondary | ICD-10-CM | POA: Diagnosis present

## 2022-09-13 DIAGNOSIS — I10 Essential (primary) hypertension: Secondary | ICD-10-CM | POA: Diagnosis present

## 2022-09-13 DIAGNOSIS — R001 Bradycardia, unspecified: Secondary | ICD-10-CM | POA: Insufficient documentation

## 2022-09-13 DIAGNOSIS — E785 Hyperlipidemia, unspecified: Secondary | ICD-10-CM | POA: Diagnosis present

## 2022-09-13 DIAGNOSIS — F419 Anxiety disorder, unspecified: Secondary | ICD-10-CM | POA: Diagnosis present

## 2022-09-13 DIAGNOSIS — Z85828 Personal history of other malignant neoplasm of skin: Secondary | ICD-10-CM | POA: Diagnosis not present

## 2022-09-13 DIAGNOSIS — F3342 Major depressive disorder, recurrent, in full remission: Secondary | ICD-10-CM | POA: Diagnosis present

## 2022-09-13 DIAGNOSIS — E876 Hypokalemia: Secondary | ICD-10-CM | POA: Insufficient documentation

## 2022-09-13 DIAGNOSIS — Z79899 Other long term (current) drug therapy: Secondary | ICD-10-CM | POA: Diagnosis not present

## 2022-09-13 DIAGNOSIS — K8689 Other specified diseases of pancreas: Secondary | ICD-10-CM | POA: Diagnosis present

## 2022-09-13 DIAGNOSIS — R7989 Other specified abnormal findings of blood chemistry: Secondary | ICD-10-CM | POA: Insufficient documentation

## 2022-09-13 DIAGNOSIS — D849 Immunodeficiency, unspecified: Secondary | ICD-10-CM | POA: Diagnosis present

## 2022-09-13 DIAGNOSIS — G4700 Insomnia, unspecified: Secondary | ICD-10-CM | POA: Diagnosis present

## 2022-09-13 DIAGNOSIS — Z96652 Presence of left artificial knee joint: Secondary | ICD-10-CM | POA: Diagnosis present

## 2022-09-13 DIAGNOSIS — G4733 Obstructive sleep apnea (adult) (pediatric): Secondary | ICD-10-CM | POA: Diagnosis present

## 2022-09-13 DIAGNOSIS — E039 Hypothyroidism, unspecified: Secondary | ICD-10-CM | POA: Diagnosis present

## 2022-09-13 DIAGNOSIS — Z96653 Presence of artificial knee joint, bilateral: Secondary | ICD-10-CM | POA: Diagnosis present

## 2022-09-13 DIAGNOSIS — Z6834 Body mass index (BMI) 34.0-34.9, adult: Secondary | ICD-10-CM | POA: Diagnosis not present

## 2022-09-13 DIAGNOSIS — R748 Abnormal levels of other serum enzymes: Secondary | ICD-10-CM | POA: Diagnosis present

## 2022-09-13 DIAGNOSIS — K831 Obstruction of bile duct: Secondary | ICD-10-CM | POA: Diagnosis present

## 2022-09-13 DIAGNOSIS — C259 Malignant neoplasm of pancreas, unspecified: Secondary | ICD-10-CM | POA: Diagnosis present

## 2022-09-13 DIAGNOSIS — Z79890 Hormone replacement therapy (postmenopausal): Secondary | ICD-10-CM | POA: Diagnosis not present

## 2022-09-13 DIAGNOSIS — Z96643 Presence of artificial hip joint, bilateral: Secondary | ICD-10-CM | POA: Diagnosis present

## 2022-09-13 DIAGNOSIS — E440 Moderate protein-calorie malnutrition: Secondary | ICD-10-CM | POA: Diagnosis present

## 2022-09-13 LAB — CBC WITH DIFFERENTIAL/PLATELET
Abs Immature Granulocytes: 0.04 10*3/uL (ref 0.00–0.07)
Basophils Absolute: 0.1 10*3/uL (ref 0.0–0.1)
Basophils Relative: 1 %
Eosinophils Absolute: 0.2 10*3/uL (ref 0.0–0.5)
Eosinophils Relative: 2 %
HCT: 33 % — ABNORMAL LOW (ref 36.0–46.0)
Hemoglobin: 11.1 g/dL — ABNORMAL LOW (ref 12.0–15.0)
Immature Granulocytes: 0 %
Lymphocytes Relative: 14 %
Lymphs Abs: 1.4 10*3/uL (ref 0.7–4.0)
MCH: 30.1 pg (ref 26.0–34.0)
MCHC: 33.6 g/dL (ref 30.0–36.0)
MCV: 89.4 fL (ref 80.0–100.0)
Monocytes Absolute: 0.6 10*3/uL (ref 0.1–1.0)
Monocytes Relative: 6 %
Neutro Abs: 8 10*3/uL — ABNORMAL HIGH (ref 1.7–7.7)
Neutrophils Relative %: 77 %
Platelets: 284 10*3/uL (ref 150–400)
RBC: 3.69 MIL/uL — ABNORMAL LOW (ref 3.87–5.11)
RDW: 17.1 % — ABNORMAL HIGH (ref 11.5–15.5)
WBC: 10.3 10*3/uL (ref 4.0–10.5)
nRBC: 0 % (ref 0.0–0.2)

## 2022-09-13 LAB — PHOSPHORUS: Phosphorus: 3.5 mg/dL (ref 2.5–4.6)

## 2022-09-13 LAB — BASIC METABOLIC PANEL
Anion gap: 12 (ref 5–15)
BUN: 25 mg/dL — ABNORMAL HIGH (ref 8–23)
CO2: 23 mmol/L (ref 22–32)
Calcium: 8.5 mg/dL — ABNORMAL LOW (ref 8.9–10.3)
Chloride: 98 mmol/L (ref 98–111)
Creatinine, Ser: 1.39 mg/dL — ABNORMAL HIGH (ref 0.44–1.00)
GFR, Estimated: 39 mL/min — ABNORMAL LOW (ref >60)
Glucose, Bld: 216 mg/dL — ABNORMAL HIGH (ref 70–99)
Potassium: 3 mmol/L — ABNORMAL LOW (ref 3.5–5.1)
Sodium: 133 mmol/L — ABNORMAL LOW (ref 135–145)

## 2022-09-13 LAB — HEPATIC FUNCTION PANEL
ALT: 279 U/L — ABNORMAL HIGH (ref 0–44)
AST: 229 U/L — ABNORMAL HIGH (ref 15–41)
Albumin: 2.9 g/dL — ABNORMAL LOW (ref 3.5–5.0)
Alkaline Phosphatase: 345 U/L — ABNORMAL HIGH (ref 38–126)
Bilirubin, Direct: 8.3 mg/dL — ABNORMAL HIGH (ref 0.0–0.2)
Indirect Bilirubin: 4.4 mg/dL — ABNORMAL HIGH (ref 0.3–0.9)
Total Bilirubin: 12.7 mg/dL — ABNORMAL HIGH (ref 0.3–1.2)
Total Protein: 6.9 g/dL (ref 6.5–8.1)

## 2022-09-13 LAB — MAGNESIUM: Magnesium: 2.1 mg/dL (ref 1.7–2.4)

## 2022-09-13 MED ORDER — ACETAMINOPHEN 325 MG PO TABS: 325.0000 mg | ORAL_TABLET | Freq: Four times a day (QID) | ORAL | Status: DC | PRN

## 2022-09-13 MED ORDER — HEPARIN SODIUM (PORCINE) 5000 UNIT/ML IJ SOLN
5000.0000 [IU] | Freq: Three times a day (TID) | INTRAMUSCULAR | Status: DC
  Administered 2022-09-13 – 2022-09-16 (×4): 5000 [IU] via SUBCUTANEOUS
  Filled 2022-09-13 (×7): qty 1

## 2022-09-13 MED ORDER — OXYCODONE HCL 5 MG PO TABS: 5.0000 mg | ORAL_TABLET | Freq: Four times a day (QID) | ORAL | Status: AC | PRN

## 2022-09-13 MED ORDER — ACETAMINOPHEN 325 MG PO TABS: 650.0000 mg | ORAL_TABLET | Freq: Four times a day (QID) | ORAL | Status: DC | PRN

## 2022-09-13 MED ORDER — ACETAMINOPHEN 325 MG RE SUPP: 650.0000 mg | Freq: Four times a day (QID) | RECTAL | Status: DC | PRN

## 2022-09-13 MED ORDER — ONDANSETRON HCL 4 MG/2ML IJ SOLN: 4.0000 mg | Freq: Four times a day (QID) | INTRAMUSCULAR | Status: DC | PRN

## 2022-09-13 MED ORDER — LACTATED RINGERS IV SOLN: INTRAVENOUS | Status: AC

## 2022-09-13 MED ORDER — ACETAMINOPHEN 650 MG RE SUPP: 325.0000 mg | Freq: Four times a day (QID) | RECTAL | Status: DC | PRN

## 2022-09-13 MED ORDER — MORPHINE SULFATE (PF) 4 MG/ML IV SOLN: 4.0000 mg | INTRAVENOUS | Status: DC | PRN

## 2022-09-13 MED ORDER — POTASSIUM CHLORIDE CRYS ER 20 MEQ PO TBCR
40.0000 meq | EXTENDED_RELEASE_TABLET | Freq: Once | ORAL | Status: AC
  Administered 2022-09-13: 40 meq via ORAL
  Filled 2022-09-13: qty 2

## 2022-09-13 MED ORDER — TRAZODONE HCL 50 MG PO TABS
50.0000 mg | ORAL_TABLET | Freq: Every day | ORAL | Status: DC
  Administered 2022-09-13 – 2022-09-16 (×5): 50 mg via ORAL
  Filled 2022-09-13 (×5): qty 1

## 2022-09-13 MED ORDER — ONDANSETRON HCL 4 MG PO TABS: 4.0000 mg | ORAL_TABLET | Freq: Four times a day (QID) | ORAL | Status: DC | PRN

## 2022-09-13 MED ORDER — POLYETHYLENE GLYCOL 3350 17 G PO PACK: 17.0000 g | PACK | Freq: Two times a day (BID) | ORAL | Status: AC | PRN

## 2022-09-13 MED ORDER — APREMILAST 30 MG PO TABS: 30.0000 mg | ORAL_TABLET | Freq: Two times a day (BID) | ORAL | Status: DC

## 2022-09-13 MED ORDER — PIPERACILLIN-TAZOBACTAM 3.375 G IVPB
3.3750 g | Freq: Three times a day (TID) | INTRAVENOUS | Status: DC
  Administered 2022-09-13 – 2022-09-17 (×13): 3.375 g via INTRAVENOUS
  Filled 2022-09-13 (×13): qty 50

## 2022-09-13 MED ORDER — SENNOSIDES-DOCUSATE SODIUM 8.6-50 MG PO TABS: 1.0000 | ORAL_TABLET | Freq: Every evening | ORAL | Status: DC | PRN

## 2022-09-13 NOTE — ED Provider Notes (Signed)
Care assumed of patient from outgoing provider.  See their note for initial history, exam and plan.  Patient is currently boarding in the emergency department waiting for ERCP capable hospital and has been accepted to [facility] however does not currently have a bed.  Called back today still waiting for a bed.  GI evaluated the patient in the emergency department and started on antibiotics.  Confirmed with [facility] that she will be kept on the waiting list and if admitted to the hospital once bed is available will be notified for transfer.      Corena Herter, MD 09/13/22 1550

## 2022-09-13 NOTE — Hospital Course (Addendum)
Ms. Victoria Flynn is a 77 year old female with hypertension, depression, anxiety, insomnia, hypothyroid, obstructive sleep apnea, immunosuppression, hyperlipidemia, who presents to the emergency department for chief concerns of painless jaundice.  Vitals at the time of hospital admission request showed temperature 98.4, Respiration rate of 18, heart rate of 53, blood pressure 146/75, SpO2 of 96% on room air.  Serum sodium 133, potassium 3.0, chloride 98, bicarb 23, BUN of 25, serum creatinine of 1.39, EGFR 39, WBC 10.3, hemoglobin 11.1, platelets of 284.  AST 229, ALT 279, alk phos 345, T bilirubin 12.7, direct bili was 8.3, indirect bili 4.4.  On 7/23: Patient was seen at outpatient PCPs office for generalized jaundice.  CT abd/pel w/ contrast outpatient: Read as status post cholecystectomy.  Marked intra and extrahepatic biliary dilatation with interval pancreatic ductal dilatation, concerning for biliary obstruction.  Possible ill-defined hypodense mass at the uncinate process of pancreas though there is ill-defined soft tissue density as well as anticipated region of the ampulla.  Recommend correlation with ERCP.  EDP has called [facility], patient has been accepted and pending bed availability on 09/12/2022.  GI was consulted for consideration of ERCP.  GI service, Dr. Mia Creek saw the patient in consultation and discussed with the patient that she will need ERCP plus or minus EUS.  On 09/13/2022: Was patient was started on broad-spectrum antibiotic by GI service.  EDP called [facility], and was told that patient is still on the wait list for transfer pending bed availability.  It was advised that patient can be admitted into the hospital without losing her place in line pending bed availability.  Patient admitted for inpatient hospice service.  ED treatment: Home medications were resumed.

## 2022-09-13 NOTE — Assessment & Plan Note (Signed)
Unchanged EKG compared to EKG in 09/09/2018.

## 2022-09-13 NOTE — Assessment & Plan Note (Signed)
Citalopram 20 mg daily; trazodone 50 mg nightly

## 2022-09-13 NOTE — Assessment & Plan Note (Deleted)
GI service has been consulted, patient pending bed availability at [facility] Patient on waiting list, and has not lost her place for transfer Oxycodone 5 mg p.o. every 6 hours as needed for moderate pain, 1 day ordered; morphine 4 mg IV every 4 hours as needed for severe pain, 4 doses ordered Admit to telemetry medical, inpatient

## 2022-09-13 NOTE — Assessment & Plan Note (Signed)
Confirmed with patient at bedside with her spouse and daughter in the room

## 2022-09-13 NOTE — ED Notes (Signed)
called to [facility] per MD Quale for pt admission update/still no bed assignment.

## 2022-09-13 NOTE — Consult Note (Signed)
Consultation  Referring Provider:    ED  Consult date: 09/13/2022         Reason for Consultation:   Abnormal liver enzymes/abnormal imaging           HPI:   Victoria Flynn is a 77 y.o. lady with history of COPD, sleep apnea, and hypertension who presented to the ED yesterday for elevated t. Bili to > 10 and abdominal pain. A CT scan showed concerning findings in the pancrease for potential malignancy. She states she has had had dark urine for the past 4 weeks and hasn't felt well for 3-4 weeks. She presented to her PCP yesterday for abdominal pain and family noticing yellowing of her skin. No family history of GI malignancies. She never smoked but got COPD from second hand smoke. No alcohol use. Her blood pressure has been greatly increased from baseline as well. Has history of cholecystectomy.   Past Medical History:  Diagnosis Date   Acquired underactive thyroid    Anxiety    Arthritis    Cancer (HCC)    skin   Arms and chest   Complication of anesthesia    dry  heaves   COPD (chronic obstructive pulmonary disease) (HCC)    mild   Depression    Dysrhythmia    tachyarrythmias   Headache    Hypertension    MRSA (methicillin resistant Staphylococcus aureus) colonization    PONV (postoperative nausea and vomiting)    Psoriasis    Sleep apnea    cpap   Squamous cell carcinoma of skin 07/29/2020   left popliteal    Past Surgical History:  Procedure Laterality Date   BREAST EXCISIONAL BIOPSY Left    neg 1970's   BREAST SURGERY Left 1970   cyst removed   CHOLECYSTECTOMY     COLONOSCOPY N/A 04/19/2022   Procedure: COLONOSCOPY;  Surgeon: Toledo, Boykin Nearing, MD;  Location: [facility] ENDOSCOPY;  Service: Gastroenterology;  Laterality: N/A;   COLONOSCOPY WITH PROPOFOL N/A 08/12/2018   Procedure: COLONOSCOPY WITH PROPOFOL;  Surgeon: Scot Jun, MD;  Location: [facility] ENDOSCOPY;  Service: Endoscopy;  Laterality: N/A;   EYE SURGERY Bilateral    cataract   JOINT REPLACEMENT Left 2005    hip   JOINT REPLACEMENT Right 2008   hip   JOINT REPLACEMENT Right 2013   knee   KNEE ARTHROPLASTY Left 09/20/2018   Procedure: COMPUTER ASSISTED TOTAL KNEE ARTHROPLASTY - RNFA;  Surgeon: Donato Heinz, MD;  Location: [facility] ORS;  Service: Orthopedics;  Laterality: Left;    Family History  Problem Relation Age of Onset   Breast cancer Paternal Aunt     Social History   Tobacco Use   Smoking status: Never   Smokeless tobacco: Never  Vaping Use   Vaping status: Never Used  Substance Use Topics   Alcohol use: No   Drug use: No    Prior to Admission medications   Medication Sig Start Date End Date Taking? Authorizing Provider  acetaminophen (TYLENOL) 650 MG CR tablet Take 1,300 mg by mouth 2 (two) times a day.   Yes [provider]  albuterol (VENTOLIN HFA) 108 (90 Base) MCG/ACT inhaler Inhale 2 puffs into the lungs every 6 (six) hours as needed. 05/10/22 05/10/23 Yes [provider]  amLODipine (NORVASC) 5 MG tablet Take 5 mg by mouth daily. 09/22/14  Yes [provider]  Apremilast (OTEZLA) 30 MG TABS Take 1 tablet (30 mg total) by mouth 2 (two) times daily. 09/06/21  Yes  Deirdre Evener, MD  Aspirin-Salicylamide-Caffeine [facility] HEADACHE POWDER PO) Take 1 packet by mouth daily as needed (headache).   Yes [provider]  calcium-vitamin D (OSCAL WITH D) 500-200 MG-UNIT per tablet Take 1 tablet by mouth 2 (two) times daily.   Yes [provider]  carvedilol (COREG) 6.25 MG tablet Take 6.25 mg by mouth 2 (two) times daily.   Yes [provider]  citalopram (CELEXA) 20 MG tablet Take 20 mg by mouth daily. 07/11/22 07/11/23 Yes [provider]  clobetasol ointment (TEMOVATE) 0.05 % Apply 1 application topically 2 (two) times a week.   Yes [provider]  galantamine (RAZADYNE ER) 16 MG 24 hr capsule Take 16 mg by mouth daily.   Yes [provider]  hydrochlorothiazide (HYDRODIURIL) 25 MG tablet Take 25 mg by  mouth daily. 04/06/20 08/14/23 Yes [provider]  hydrocortisone 2.5 % cream Apply topically 3 (three) times a week. Apply to rash in groin 3 nights weekly, Tuesday, Thursday, Saturday prn flares 09/07/21  Yes Deirdre Evener, MD  ketoconazole (NIZORAL) 2 % cream Apply 1 Application topically as needed. 09/06/21  Yes [provider]  levothyroxine (SYNTHROID, LEVOTHROID) 137 MCG tablet Take 137 mcg by mouth every morning.  09/28/14  Yes [provider]  LORazepam (ATIVAN) 0.5 MG tablet Take 1 mg by mouth at bedtime. For anxiety. 08/25/14  Yes [provider]  losartan (COZAAR) 100 MG tablet Take 100 mg by mouth daily.   Yes [provider]  MELATONIN MAXIMUM STRENGTH PO Take 5-15 mg by mouth at bedtime.    Yes [provider]  memantine (NAMENDA) 10 MG tablet Take 10 mg by mouth 2 (two) times daily.   Yes [provider]  Omega-3 Fatty Acids (FISH OIL) 1000 MG CAPS Take 1,000 mg by mouth daily.   Yes [provider]  traZODone (DESYREL) 50 MG tablet Take 50 mg by mouth daily.  10/05/17  Yes [provider]  triamcinolone (NASACORT) 55 MCG/ACT AERO nasal inhaler Place 2 sprays into the nose daily. 09/04/22  Yes [provider]  betamethasone dipropionate 0.05 % cream Apply topically 2 (two) times daily as needed (Rash). Patient not taking: Reported on 09/12/2022 12/11/19   Deirdre Evener, MD  carvedilol (COREG) 3.125 MG tablet Take 3.125 mg by mouth 2 (two) times daily. Patient not taking: Reported on 09/12/2022 09/15/14   [provider]  citalopram (CELEXA) 10 MG tablet Take 10 mg by mouth every morning. Patient not taking: Reported on 09/12/2022 08/28/14   [provider]  donepezil (ARICEPT) 5 MG tablet Take 5 mg by mouth daily with breakfast. Patient not taking: Reported on 09/12/2022    [provider]  losartan (COZAAR) 50 MG tablet Take 50 mg by mouth daily.  Patient not taking:  Reported on 09/12/2022 09/17/17   [provider]  memantine [facility]) 5 MG tablet Take by mouth. 11/11/18 11/11/19  [provider]  potassium chloride (K-DUR) 10 MEQ tablet Take 1 tablet by mouth 2 (two) times a day. 09/16/18 09/16/19  [provider]  Tapinarof (VTAMA) 1 % CREA Apply 1 application topically daily. Patient not taking: Reported on 09/12/2022 12/06/20   Deirdre Evener, MD    Current Facility-Administered Medications  Medication Dose Route Frequency Provider Last Rate Last Admin   amLODipine (NORVASC) tablet 5 mg  5 mg Oral Daily Corena Herter, MD   5 mg at 09/13/22 0916   Apremilast TABS 30 mg  30 mg Oral BID Otelia Sergeant, RPH       carvedilol (COREG) tablet 6.25 mg  6.25 mg Oral BID Corena Herter, MD   6.25 mg at 09/13/22 0915   citalopram (CELEXA) tablet 20 mg  20 mg Oral Daily Mumma, Carollee Herter, MD   20 mg at 09/13/22 0915   levothyroxine (SYNTHROID) tablet 137 mcg  137 mcg Oral Q0600 Corena Herter, MD   137 mcg at 09/13/22 0610   losartan (COZAAR) tablet 100 mg  100 mg Oral Daily Mumma, Carollee Herter, MD   100 mg at 09/13/22 0915   memantine (NAMENDA) tablet 10 mg  10 mg Oral BID Corena Herter, MD   10 mg at 09/13/22 0915   piperacillin-tazobactam (ZOSYN) IVPB 3.375 g  3.375 g Intravenous Q8H Joselyne Spake, Rossie Muskrat, MD       traZODone (DESYREL) tablet 50 mg  50 mg Oral QHS Otelia Sergeant, RPH   50 mg at 09/13/22 0040   Current Outpatient Medications  Medication Sig Dispense Refill   acetaminophen (TYLENOL) 650 MG CR tablet Take 1,300 mg by mouth 2 (two) times a day.     albuterol (VENTOLIN HFA) 108 (90 Base) MCG/ACT inhaler Inhale 2 puffs into the lungs every 6 (six) hours as needed.     amLODipine (NORVASC) 5 MG tablet Take 5 mg by mouth daily.     Apremilast (OTEZLA) 30 MG TABS Take 1 tablet (30 mg total) by mouth 2 (two) times daily. 180 tablet 1   Aspirin-Salicylamide-Caffeine (BC HEADACHE POWDER PO) Take 1 packet by mouth daily as needed  (headache).     calcium-vitamin D (OSCAL WITH D) 500-200 MG-UNIT per tablet Take 1 tablet by mouth 2 (two) times daily.     carvedilol (COREG) 6.25 MG tablet Take 6.25 mg by mouth 2 (two) times daily.     citalopram (CELEXA) 20 MG tablet Take 20 mg by mouth daily.     clobetasol ointment (TEMOVATE) 0.05 % Apply 1 application topically 2 (two) times a week.     galantamine (RAZADYNE ER) 16 MG 24 hr capsule Take 16 mg by mouth daily.     hydrochlorothiazide (HYDRODIURIL) 25 MG tablet Take 25 mg by mouth daily.     hydrocortisone 2.5 % cream Apply topically 3 (three) times a week. Apply to rash in groin 3 nights weekly, Tuesday, Thursday, Saturday prn flares 60 g 3   ketoconazole (NIZORAL) 2 % cream Apply 1 Application topically as needed.     levothyroxine (SYNTHROID, LEVOTHROID) 137 MCG tablet Take 137 mcg by mouth every morning.      LORazepam (ATIVAN) 0.5 MG tablet Take 1 mg by mouth at bedtime. For anxiety.     losartan (COZAAR) 100 MG tablet Take 100 mg by mouth daily.     MELATONIN MAXIMUM STRENGTH PO Take 5-15 mg by mouth at bedtime.      memantine (NAMENDA) 10 MG tablet Take 10 mg by mouth 2 (two) times daily.     Omega-3 Fatty Acids (FISH OIL) 1000 MG CAPS Take 1,000 mg by mouth daily.     traZODone (DESYREL) 50 MG tablet Take 50 mg by mouth daily.      triamcinolone (NASACORT) 55 MCG/ACT AERO nasal inhaler Place 2 sprays into the nose daily.     betamethasone dipropionate 0.05 % cream Apply topically 2 (two) times daily as needed (Rash). (Patient not taking: Reported on 09/12/2022) 45 g 0   carvedilol (COREG) 3.125 MG tablet Take 3.125 mg by mouth 2 (  two) times daily. (Patient not taking: Reported on 09/12/2022)     citalopram (CELEXA) 10 MG tablet Take 10 mg by mouth every morning. (Patient not taking: Reported on 09/12/2022)     donepezil (ARICEPT) 5 MG tablet Take 5 mg by mouth daily with breakfast. (Patient not taking: Reported on 09/12/2022)     losartan (COZAAR) 50 MG tablet Take 50 mg  by mouth daily.  (Patient not taking: Reported on 09/12/2022)     memantine (NAMENDA) 5 MG tablet Take by mouth.     potassium chloride (K-DUR) 10 MEQ tablet Take 1 tablet by mouth 2 (two) times a day.     Tapinarof (VTAMA) 1 % CREA Apply 1 application topically daily. (Patient not taking: Reported on 09/12/2022) 60 g 2    Allergies as of 09/12/2022 - Review Complete 09/12/2022  Allergen Reaction Noted   Morphine and codeine Nausea And Vomiting 10/03/2014     Review of Systems:    All systems reviewed and negative except where noted in HPI.  Review of Systems  Constitutional:  Positive for malaise/fatigue. Negative for chills and fever.  Respiratory:  Negative for shortness of breath.   Cardiovascular:  Negative for chest pain.  Gastrointestinal:  Positive for abdominal pain and nausea.  Musculoskeletal:  Negative for joint pain.  Skin:  Negative for itching.  Neurological:  Negative for focal weakness.  Psychiatric/Behavioral:  Negative for substance abuse.   All other systems reviewed and are negative.      Physical Exam:  Vital signs in last 24 hours: Temp:  [98 F (36.7 C)-98.5 F (36.9 C)] 98.3 F (36.8 C) (07/24 0609) Pulse Rate:  [43-57] 47 (07/24 1100) Resp:  [17-24] 17 (07/24 1100) BP: (158-193)/(68-88) 180/79 (07/24 1100) SpO2:  [93 %-96 %] 95 % (07/24 1100) Weight:  [82.1 kg] 82.1 kg (07/23 1627)   General:   Very jaundiced Head:  Normocephalic and atraumatic. Eyes:   Scleral icterus present Mouth: Mucosa pink moist, no lesions. Neck:  Supple; no masses felt Lungs:  No respiratory distress. Abdomen:   Flat, soft, nondistended, nontender Msk:   Symmetrical without gross deformities. Neurologic:  Alert and  oriented x4;  No focal deficits Skin:  Warm, dry, pink without significant lesions or rashes. Psych:  Alert and cooperative. Normal affect.  LAB RESULTS: Recent Labs    09/12/22 1926  WBC 10.3  HGB 11.0*  HCT 33.3*  PLT 279   BMET Recent Labs     09/12/22 1926  NA 136  K 3.0*  CL 102  CO2 22  GLUCOSE 124*  BUN 23  CREATININE 1.10*  CALCIUM 8.8*   LFT Recent Labs    09/12/22 1926  PROT 7.0  ALBUMIN 3.3*  AST 205*  ALT 266*  ALKPHOS 315*  BILITOT 11.4*   PT/INR Recent Labs    09/12/22 1926  LABPROT 14.3  INR 1.1    STUDIES: CT ABDOMEN PELVIS W CONTRAST  Result Date: 09/12/2022 CLINICAL DATA:  Generalized abdominal pain EXAM: CT ABDOMEN AND PELVIS WITH CONTRAST TECHNIQUE: Multidetector CT imaging of the abdomen and pelvis was performed using the standard protocol following bolus administration of intravenous contrast. RADIATION DOSE REDUCTION: This exam was performed according to the departmental dose-optimization program which includes automated exposure control, adjustment of the mA and/or kV according to patient size and/or use of iterative reconstruction technique. CONTRAST:  80mL OMNIPAQUE IOHEXOL 300 MG/ML  SOLN COMPARISON:  CT 05/20/2020 FINDINGS: Lower chest: Lung bases demonstrate no acute airspace disease. Normal cardiac  size. Hepatobiliary: Status post cholecystectomy. Marked intra and extrahepatic biliary dilatation, common bile duct dilated up to 19 mm. Pancreas: No inflammation. Interval pancreatic ductal dilatation. Possible ill-defined soft tissue mass at the uncinate process versus ampullary region of duodenum, series 2, image 32 and series 2, image 34. Spleen: Normal in size without focal abnormality. Adrenals/Urinary Tract: Right adrenal gland is normal. 14 mm left adrenal nodule containing fat. Kidneys show no hydronephrosis. Small bilateral nonobstructing kidney stones. Multiple subcentimeter hypodensities too small to further characterize. The bladder is obscured by artifact Stomach/Bowel: The stomach is nonenlarged. There is no dilated small bowel. No acute bowel wall thickening. Negative appendix. Diverticular disease of left colon. Vascular/Lymphatic: Mild aortic atherosclerosis. No aneurysm. No  suspicious lymph nodes. Reproductive: Large calcified uterine fibroid Other: Negative for pelvic effusion or free air. Small fat containing umbilical hernia Musculoskeletal: Bilateral hip replacements with artifact. Multilevel degenerative changes of the spine. No acute osseous abnormality. IMPRESSION: 1. Status post cholecystectomy. Marked intra and extrahepatic biliary dilatation with interval pancreatic ductal dilatation, concerning for biliary obstruction. Possible ill-defined hypodense mass at the uncinate process of pancreas though there is ill-defined soft tissue density as well in the anticipated region of the ampulla. Recommend correlation with ERCP. 2. 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 3. Bilateral nonobstructing kidney stones. 4. Large calcified uterine fibroid. 5. Aortic atherosclerosis. 6. 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 No imaging follow-up is recommended. Aortic Atherosclerosis (ICD10-I70.0). Electronically Signed   By: Jasmine Pang M.D.   On: 09/12/2022 19:20       Impression / Plan:   77 y/o lady with mild COPD and hypertension who presents with jaundice concerning for pancreatic malignancy given abnormal CT  - typically the risk for infection from obstruction due to pancreatic malignancy is lower than choledocholithiasis but will start antibiotics anyway as unclear when ERCP is going to be able to be done - given no proceduralist here is able to perform ERCP, will need to be transferred which is pending. - discussed with patient about ERCP +- EUS depending on team who will evaluate her  Will continue to follow for now until able to be transferred. Please call with any questions or concerns.  Merlyn Lot MD, MPH [facility]

## 2022-09-13 NOTE — Assessment & Plan Note (Addendum)
Suspect secondary to biliary obstruction in setting of concerning pancreatic mass GI service has been consulted Patient pending bed availability at [facility] Patient on waiting list, and has not lost her place for transfer Oxycodone 5 mg p.o. every 6 hours as needed for moderate pain, 1 day ordered; morphine 4 mg IV every 4 hours as needed for severe pain, 4 doses ordered Lactated ringer infusion at 125 mL/h, 2 days ordered Recheck BMP, LFTs in a.m. Admit to telemetry medical, inpatient

## 2022-09-13 NOTE — Assessment & Plan Note (Signed)
Does not meet criteria for acute kidney injury at this time Continue IV fluid hydration, recheck BMP in a.m.

## 2022-09-13 NOTE — Assessment & Plan Note (Signed)
Patient reports she had a bowel movement on day of admission, she states it was 1 time and it was watery Currently at bedside no risk for constipation however with patient's history I have resumed senna docusate 1 tablet nightly as needed for mild constipation; GlycoLax 17 g twice daily as needed for moderate constipation

## 2022-09-13 NOTE — Assessment & Plan Note (Addendum)
Home amlodipine 5 mg daily, losartan 100 mg daily, carvedilol 6.25 mg p.o. twice daily, were resumed

## 2022-09-13 NOTE — H&P (Addendum)
History and Physical   MERLIN EGE GNF:[id number] DOB: 09-Sep-1945 DOA: 09/12/2022  PCP: Lynnea Ferrier, MD  Outpatient Specialists: Dr. Hedy Jacob clinic pulmonology Patient coming from: Home  I have personally briefly reviewed patient's old medical records in [facility] EMR.  Chief Concern: Jaundice biliary obstruction  HPI: Ms. Victoria Flynn is a 77 year old female with hypertension, depression, anxiety, insomnia, hypothyroid, obstructive sleep apnea, immunosuppression, hyperlipidemia, who presents to the emergency department for chief concerns of painless jaundice.  Vitals at the time of hospital admission request showed temperature 98.4, Respiration rate of 18, heart rate of 53, blood pressure 146/75, SpO2 of 96% on room air.  Serum sodium 133, potassium 3.0, chloride 98, bicarb 23, BUN of 25, serum creatinine of 1.39, EGFR 39, WBC 10.3, hemoglobin 11.1, platelets of 284.  AST 229, ALT 279, alk phos 345, T bilirubin 12.7, direct bili was 8.3, indirect bili 4.4.  On 7/23: Patient was seen at outpatient PCPs office for generalized jaundice.  CT abd/pel w/ contrast outpatient: Read as status post cholecystectomy.  Marked intra and extrahepatic biliary dilatation with interval pancreatic ductal dilatation, concerning for biliary obstruction.  Possible ill-defined hypodense mass at the uncinate process of pancreas though there is ill-defined soft tissue density as well as anticipated region of the ampulla.  Recommend correlation with ERCP.  EDP has called [facility], patient has been accepted and pending bed availability on 09/12/2022.  GI was consulted for consideration of ERCP.  GI service, Dr. Mia Creek saw the patient in consultation and discussed with the patient that she will need ERCP plus or minus EUS.  On 09/13/2022: Was patient was started on broad-spectrum antibiotic by GI service.  EDP called [facility], and was told that patient is still on the wait list for  transfer pending bed availability.  It was advised that patient can be admitted into the hospital without losing her place in line pending bed availability.  Patient admitted for inpatient hospice service.  ED treatment: Home medications were resumed. ------------------------------------------- At bedside, patient was able to tell me her name, age, location, current calendar year.  She appears jaundiced at bedside and does not appear to be in acute distress.  She reports the progressive yellowing of her skin started about 2 weeks ago.  She denies abdominal pain, saw her PCP for yellowing in her skin.  She endorses her urine is very yellow and that her stool has been loose daily.  She denies diarrhea.  She endorses an unintentional weight loss of approximately 10 pounds over the last several weeks.  She reports that during this time, she had no appetite and just looking at food was not appetizing for her therefore she did not eat very well.  She denies shortness of breath, chest pain, abdominal pain, dysuria, hematuria, fever, chills, syncope, swelling of her lower extremities.  Social history: She lives at home with her husband.  Family history: No known family history of pancreatic cancer  ROS: Constitutional: + weight change, no fever ENT/Mouth: no sore throat, no rhinorrhea Eyes: no eye pain, no vision changes Cardiovascular: no chest pain, no dyspnea,  no edema, no palpitations Respiratory: no cough, no sputum, no wheezing Gastrointestinal: no nausea, no vomiting, no diarrhea, no constipation Genitourinary: no urinary incontinence, no dysuria, no hematuria Musculoskeletal: no arthralgias, no myalgias Skin: no skin lesions, no pruritus, + yellowing of her skin Neuro: + weakness, no loss of consciousness, no syncope Psych: no anxiety, no depression, + decrease appetite Heme/Lymph: no bruising,  no bleeding  ED Course: Discussed with emergency medicine provider, patient requiring  hospitalization for chief concerns of pancreatic cancer, hypokalemia.  Assessment/Plan  Principal Problem:   Elevated liver enzymes Active Problems:   History of constipation   History of colonic polyps   Hyperlipidemia   Hypertension   Hypothyroidism   Recurrent major depressive disorder, in full remission (HCC)   Sleep apnea   Total knee replacement status   Hypokalemia   DNR (do not resuscitate)   Asymptomatic bradycardia   Elevated serum creatinine   Assessment and Plan:  * Elevated liver enzymes Suspect secondary to biliary obstruction in setting of concerning pancreatic mass GI service has been consulted Patient pending bed availability at [facility] Patient on waiting list, and has not lost her place for transfer Oxycodone 5 mg p.o. every 6 hours as needed for moderate pain, 1 day ordered; morphine 4 mg IV every 4 hours as needed for severe pain, 4 doses ordered Lactated ringer infusion at 125 mL/h, 2 days ordered Recheck BMP, LFTs in a.m. Admit to telemetry medical, inpatient  Elevated serum creatinine Does not meet criteria for acute kidney injury at this time Continue IV fluid hydration, recheck BMP in a.m.  Asymptomatic bradycardia Unchanged EKG compared to EKG in 09/09/2018.  DNR (do not resuscitate) Confirmed with patient at bedside with her spouse and daughter in the room  Hypokalemia Check magnesium level on admission Replace with potassium chloride 40 mEq p.o. one-time dose Recheck BMP in the a.m.  Recurrent major depressive disorder, in full remission (HCC) Citalopram 20 mg daily; trazodone 50 mg nightly  Hypothyroidism Levothyroxine 137 mcg  Hypertension Home amlodipine 5 mg daily, losartan 100 mg daily, carvedilol 6.25 mg p.o. twice daily, were resumed  History of constipation Patient reports she had a bowel movement on day of admission, she states it was 1 time and it was watery Currently at bedside no risk for constipation  however with patient's history I have resumed senna docusate 1 tablet nightly as needed for mild constipation; GlycoLax 17 g twice daily as needed for moderate constipation  Chart reviewed.   DVT prophylaxis: Heparin 5000 units subcutaneous every 8 hours Code Status: DNR Diet: Regular diet Family Communication: Discussed with patient's spouse and patient's daughter at bedside with patient's permission Disposition Plan: Pending [facility] transfer; guarded prognosis Consults called: Gastroenterology service Admission status: Telemetry medical, inpatient  Past Medical History:  Diagnosis Date   Acquired underactive thyroid    Anxiety    Arthritis    Cancer (HCC)    skin   Arms and chest   Complication of anesthesia    dry  heaves   COPD (chronic obstructive pulmonary disease) (HCC)    mild   Depression    Dysrhythmia    tachyarrythmias   Headache    Hypertension    MRSA (methicillin resistant Staphylococcus aureus) colonization    PONV (postoperative nausea and vomiting)    Psoriasis    Sleep apnea    cpap   Squamous cell carcinoma of skin 07/29/2020   left popliteal   Past Surgical History:  Procedure Laterality Date   BREAST EXCISIONAL BIOPSY Left    neg 1970's   BREAST SURGERY Left 1970   cyst removed   CHOLECYSTECTOMY     COLONOSCOPY N/A 04/19/2022   Procedure: COLONOSCOPY;  Surgeon: Toledo, Boykin Nearing, MD;  Location: [facility] ENDOSCOPY;  Service: Gastroenterology;  Laterality: N/A;   COLONOSCOPY WITH PROPOFOL N/A 08/12/2018   Procedure: COLONOSCOPY  WITH PROPOFOL;  Surgeon: Scot Jun, MD;  Location: [facility] ENDOSCOPY;  Service: Endoscopy;  Laterality: N/A;   EYE SURGERY Bilateral    cataract   JOINT REPLACEMENT Left 2005   hip   JOINT REPLACEMENT Right 2008   hip   JOINT REPLACEMENT Right 2013   knee   KNEE ARTHROPLASTY Left 09/20/2018   Procedure: COMPUTER ASSISTED TOTAL KNEE ARTHROPLASTY - RNFA;  Surgeon: Donato Heinz, MD;  Location: [facility]  ORS;  Service: Orthopedics;  Laterality: Left;   Social History:  reports that she has never smoked. She has never used smokeless tobacco. She reports that she does not drink alcohol and does not use drugs.  No Active Allergies Family History  Problem Relation Age of Onset   Breast cancer Paternal Aunt    Family history: Family history reviewed and not pertinent.  Prior to Admission medications   Medication Sig Start Date End Date Taking? Authorizing Provider  acetaminophen (TYLENOL) 650 MG CR tablet Take 1,300 mg by mouth 2 (two) times a day.   Yes [provider]  albuterol (VENTOLIN HFA) 108 (90 Base) MCG/ACT inhaler Inhale 2 puffs into the lungs every 6 (six) hours as needed. 05/10/22 05/10/23 Yes [provider]  amLODipine (NORVASC) 5 MG tablet Take 5 mg by mouth daily. 09/22/14  Yes [provider]  Apremilast (OTEZLA) 30 MG TABS Take 1 tablet (30 mg total) by mouth 2 (two) times daily. 09/06/21  Yes Deirdre Evener, MD  Aspirin-Salicylamide-Caffeine [facility] HEADACHE POWDER PO) Take 1 packet by mouth daily as needed (headache).   Yes [provider]  calcium-vitamin D (OSCAL WITH D) 500-200 MG-UNIT per tablet Take 1 tablet by mouth 2 (two) times daily.   Yes [provider]  carvedilol (COREG) 6.25 MG tablet Take 6.25 mg by mouth 2 (two) times daily.   Yes [provider]  citalopram (CELEXA) 20 MG tablet Take 20 mg by mouth daily. 07/11/22 07/11/23 Yes [provider]  clobetasol ointment (TEMOVATE) 0.05 % Apply 1 application topically 2 (two) times a week.   Yes [provider]  galantamine (RAZADYNE ER) 16 MG 24 hr capsule Take 16 mg by mouth daily.   Yes [provider]  hydrochlorothiazide (HYDRODIURIL) 25 MG tablet Take 25 mg by mouth daily. 04/06/20 08/14/23 Yes [provider]  hydrocortisone 2.5 % cream Apply topically 3 (three) times a week. Apply to rash in groin 3 nights weekly, Tuesday,  Thursday, Saturday prn flares 09/07/21  Yes Deirdre Evener, MD  ketoconazole (NIZORAL) 2 % cream Apply 1 Application topically as needed. 09/06/21  Yes [provider]  levothyroxine (SYNTHROID, LEVOTHROID) 137 MCG tablet Take 137 mcg by mouth every morning.  09/28/14  Yes [provider]  LORazepam (ATIVAN) 0.5 MG tablet Take 1 mg by mouth at bedtime. For anxiety. 08/25/14  Yes [provider]  losartan (COZAAR) 100 MG tablet Take 100 mg by mouth daily.   Yes [provider]  MELATONIN MAXIMUM STRENGTH PO Take 5-15 mg by mouth at bedtime.    Yes [provider]  memantine (NAMENDA) 10 MG tablet Take 10 mg by mouth 2 (two) times daily.   Yes [provider]  Omega-3 Fatty Acids (FISH OIL) 1000 MG CAPS Take 1,000 mg by mouth daily.   Yes [provider]  traZODone (DESYREL) 50 MG tablet Take 50 mg by mouth daily.  10/05/17  Yes [provider]  triamcinolone (NASACORT) 55 MCG/ACT AERO nasal inhaler Place  2 sprays into the nose daily. 09/04/22  Yes [provider]  betamethasone dipropionate 0.05 % cream Apply topically 2 (two) times daily as needed (Rash). Patient not taking: Reported on 09/12/2022 12/11/19   Deirdre Evener, MD  carvedilol (COREG) 3.125 MG tablet Take 3.125 mg by mouth 2 (two) times daily. Patient not taking: Reported on 09/12/2022 09/15/14   [provider]  citalopram (CELEXA) 10 MG tablet Take 10 mg by mouth every morning. Patient not taking: Reported on 09/12/2022 08/28/14   [provider]  donepezil (ARICEPT) 5 MG tablet Take 5 mg by mouth daily with breakfast. Patient not taking: Reported on 09/12/2022    [provider]  losartan (COZAAR) 50 MG tablet Take 50 mg by mouth daily.  Patient not taking: Reported on 09/12/2022 09/17/17   [provider]  memantine [facility]) 5 MG tablet Take by mouth. 11/11/18 11/11/19  [provider]  potassium chloride (K-DUR) 10  MEQ tablet Take 1 tablet by mouth 2 (two) times a day. 09/16/18 09/16/19  [provider]  Tapinarof (VTAMA) 1 % CREA Apply 1 application topically daily. Patient not taking: Reported on 09/12/2022 12/06/20   Deirdre Evener, MD   Physical Exam: Vitals:   09/13/22 1200 09/13/22 1330 09/13/22 1340 09/13/22 1756  BP: (!) 145/70  (!) 150/70 (!) 177/71  Pulse: (!) 46 (!) 51 (!) 53 (!) 48  Resp: 14 15 12  (!) 22  Temp:    98.4 F (36.9 C)  TempSrc:      SpO2: 94% 96% 97% 96%  Weight:      Height:       Constitutional: appears frail, NAD, calm Eyes: PERRL, lids and conjunctivae normal ENMT: Mucous membranes are moist. Posterior pharynx clear of any exudate or lesions. Age-appropriate dentition. Hearing appropriate Neck: normal, supple, no masses, no thyromegaly Respiratory: clear to auscultation bilaterally, no wheezing, no crackles. Normal respiratory effort. No accessory muscle use.  Cardiovascular: Regular rate and rhythm, no murmurs / rubs / gallops. No extremity edema. 2+ pedal pulses. No carotid bruits.  Abdomen: no tenderness, no masses palpated, no hepatosplenomegaly. Bowel sounds positive.  Musculoskeletal: no clubbing / cyanosis. No joint deformity upper and lower extremities. Good ROM, no contractures, no atrophy. Normal muscle tone.  Skin: + Generalized jaundice, no rashes, lesions, ulcers. No induration Neurologic: Sensation intact. Strength 5/5 in all 4.  Psychiatric: Normal judgment and insight. Alert and oriented x 3. Normal mood.   EKG: independently reviewed, showing sinus bradycardia with rate of 49, QTc 448  Chest x-ray on Admission: not indicated at this time  CT ABDOMEN PELVIS W CONTRAST  Result Date: 09/12/2022 CLINICAL DATA:  Generalized abdominal pain EXAM: CT ABDOMEN AND PELVIS WITH CONTRAST TECHNIQUE: Multidetector CT imaging of the abdomen and pelvis was performed using the standard protocol following bolus administration of intravenous contrast.  RADIATION DOSE REDUCTION: This exam was performed according to the departmental dose-optimization program which includes automated exposure control, adjustment of the mA and/or kV according to patient size and/or use of iterative reconstruction technique. CONTRAST:  80mL OMNIPAQUE IOHEXOL 300 MG/ML  SOLN COMPARISON:  CT 05/20/2020 FINDINGS: Lower chest: Lung bases demonstrate no acute airspace disease. Normal cardiac size. Hepatobiliary: Status post cholecystectomy. Marked intra and extrahepatic biliary dilatation, common bile duct dilated up to 19 mm. Pancreas: No inflammation. Interval pancreatic ductal dilatation. Possible ill-defined soft tissue mass at the uncinate process versus ampullary region of duodenum, series 2, image 32 and series 2, image 34. Spleen: Normal in  size without focal abnormality. Adrenals/Urinary Tract: Right adrenal gland is normal. 14 mm left adrenal nodule containing fat. Kidneys show no hydronephrosis. Small bilateral nonobstructing kidney stones. Multiple subcentimeter hypodensities too small to further characterize. The bladder is obscured by artifact Stomach/Bowel: The stomach is nonenlarged. There is no dilated small bowel. No acute bowel wall thickening. Negative appendix. Diverticular disease of left colon. Vascular/Lymphatic: Mild aortic atherosclerosis. No aneurysm. No suspicious lymph nodes. Reproductive: Large calcified uterine fibroid Other: Negative for pelvic effusion or free air. Small fat containing umbilical hernia Musculoskeletal: Bilateral hip replacements with artifact. Multilevel degenerative changes of the spine. No acute osseous abnormality. IMPRESSION: 1. Status post cholecystectomy. Marked intra and extrahepatic biliary dilatation with interval pancreatic ductal dilatation, concerning for biliary obstruction. Possible ill-defined hypodense mass at the uncinate process of pancreas though there is ill-defined soft tissue density as well in the anticipated region of  the ampulla. Recommend correlation with ERCP. 2. 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 3. Bilateral nonobstructing kidney stones. 4. Large calcified uterine fibroid. 5. Aortic atherosclerosis. 6. 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 No imaging follow-up is recommended. Aortic Atherosclerosis (ICD10-I70.0). Electronically Signed   By: Jasmine Pang M.D.   On: 09/12/2022 19:20    Labs on Admission: I have personally reviewed following labs CBC: Recent Labs  Lab 09/12/22 1926 09/13/22 1757  WBC 10.3 10.3  NEUTROABS 7.6 8.0*  HGB 11.0* 11.1*  HCT 33.3* 33.0*  MCV 91.0 89.4  PLT 279 284   Basic Metabolic Panel: Recent Labs  Lab 09/12/22 1926 09/13/22 1757  NA 136 133*  K 3.0* 3.0*  CL 102 98  CO2 22 23  GLUCOSE 124* 216*  BUN 23 25*  CREATININE 1.10* 1.39*  CALCIUM 8.8* 8.5*  MG  --  2.1  PHOS  --  3.5   GFR: Estimated Creatinine Clearance: 31.9 mL/min (A) (by C-G formula based on SCr of 1.39 mg/dL (H)).  Liver Function Tests: Recent Labs  Lab 09/12/22 1926 09/13/22 1757  AST 205* 229*  ALT 266* 279*  ALKPHOS 315* 345*  BILITOT 11.4* 12.7*  PROT 7.0 6.9  ALBUMIN 3.3* 2.9*   Recent Labs  Lab 09/12/22 1926  LIPASE 94*   Coagulation Profile: Recent Labs  Lab 09/12/22 1926  INR 1.1   Thyroid Function Tests: Recent Labs    09/12/22 1926  TSH 2.321   Urine analysis:    Component Value Date/Time   COLORURINE YELLOW (A) 09/13/2018 1404   APPEARANCEUR CLEAR (A) 09/13/2018 1404   APPEARANCEUR Hazy 05/31/2011 1332   LABSPEC 1.018 09/13/2018 1404   LABSPEC 1.016 05/31/2011 1332   PHURINE 5.0 09/13/2018 1404   GLUCOSEU NEGATIVE 09/13/2018 1404   GLUCOSEU Negative 05/31/2011 1332   HGBUR NEGATIVE 09/13/2018 1404   BILIRUBINUR NEGATIVE 09/13/2018 1404   BILIRUBINUR Negative 05/31/2011 1332   KETONESUR NEGATIVE 09/13/2018 1404   PROTEINUR NEGATIVE 09/13/2018 1404   NITRITE NEGATIVE  09/13/2018 1404   LEUKOCYTESUR NEGATIVE 09/13/2018 1404   LEUKOCYTESUR Negative 05/31/2011 1332   This document was prepared using Dragon Voice Recognition software and may include unintentional dictation errors.  Dr. Sedalia Muta [facility]  If 7PM-7AM, please contact overnight-coverage provider If 7AM-7PM, please contact day attending provider [email or web address]  09/13/2022, 6:40 PM

## 2022-09-13 NOTE — ED Notes (Signed)
Call bell answered. Pt complaining of monitor "making that noise." Cardiac monitor leads readjusted and "that noise" stopped. Pt repositioned and denied any further needs. Call bell still within reach.

## 2022-09-13 NOTE — Assessment & Plan Note (Addendum)
Check magnesium level on admission Replace with potassium chloride 40 mEq p.o. one-time dose Recheck BMP in the a.m.

## 2022-09-13 NOTE — Assessment & Plan Note (Signed)
Levothyroxine 137 mcg

## 2022-09-14 DIAGNOSIS — R748 Abnormal levels of other serum enzymes: Secondary | ICD-10-CM

## 2022-09-14 DIAGNOSIS — E440 Moderate protein-calorie malnutrition: Secondary | ICD-10-CM | POA: Insufficient documentation

## 2022-09-14 LAB — CBC
HCT: 31.4 % — ABNORMAL LOW (ref 36.0–46.0)
Hemoglobin: 11 g/dL — ABNORMAL LOW (ref 12.0–15.0)
MCH: 30.6 pg (ref 26.0–34.0)
MCHC: 35 g/dL (ref 30.0–36.0)
MCV: 87.5 fL (ref 80.0–100.0)
Platelets: 250 10*3/uL (ref 150–400)
RBC: 3.59 MIL/uL — ABNORMAL LOW (ref 3.87–5.11)
nRBC: 0 % (ref 0.0–0.2)

## 2022-09-14 LAB — HEPATIC FUNCTION PANEL
ALT: 262 U/L — ABNORMAL HIGH (ref 0–44)
AST: 209 U/L — ABNORMAL HIGH (ref 15–41)
Albumin: 2.6 g/dL — ABNORMAL LOW (ref 3.5–5.0)
Alkaline Phosphatase: 328 U/L — ABNORMAL HIGH (ref 38–126)
Bilirubin, Direct: 8.2 mg/dL — ABNORMAL HIGH (ref 0.0–0.2)
Indirect Bilirubin: 4.8 mg/dL — ABNORMAL HIGH (ref 0.3–0.9)
Total Protein: 6.8 g/dL (ref 6.5–8.1)

## 2022-09-14 LAB — BASIC METABOLIC PANEL
Anion gap: 9 (ref 5–15)
BUN: 29 mg/dL — ABNORMAL HIGH (ref 8–23)
Calcium: 8.7 mg/dL — ABNORMAL LOW (ref 8.9–10.3)
Creatinine, Ser: 1.11 mg/dL — ABNORMAL HIGH (ref 0.44–1.00)
GFR, Estimated: 51 mL/min — ABNORMAL LOW (ref >60)
Glucose, Bld: 133 mg/dL — ABNORMAL HIGH (ref 70–99)
Potassium: 3.4 mmol/L — ABNORMAL LOW (ref 3.5–5.1)
Sodium: 138 mmol/L (ref 135–145)

## 2022-09-14 MED ORDER — RENA-VITE PO TABS
1.0000 | ORAL_TABLET | Freq: Every day | ORAL | Status: DC
  Administered 2022-09-14 – 2022-09-16 (×3): 1 via ORAL
  Filled 2022-09-14 (×4): qty 1

## 2022-09-14 MED ORDER — PROSOURCE PLUS PO LIQD
30.0000 mL | Freq: Two times a day (BID) | ORAL | Status: DC
  Administered 2022-09-14 – 2022-09-17 (×7): 30 mL via ORAL
  Filled 2022-09-14: qty 30

## 2022-09-14 MED ORDER — ALUM & MAG HYDROXIDE-SIMETH 200-200-20 MG/5ML PO SUSP
15.0000 mL | Freq: Four times a day (QID) | ORAL | Status: DC | PRN
  Administered 2022-09-14: 15 mL via ORAL
  Filled 2022-09-14: qty 30

## 2022-09-14 NOTE — Progress Notes (Signed)
PROGRESS NOTE    Victoria Flynn  WGN:[id number] DOB: December 06, 1945 DOA: 09/12/2022 PCP: Lynnea Ferrier, MD    Brief Narrative:   77 year old female with hypertension, depression, anxiety, insomnia, hypothyroid, obstructive sleep apnea, immunosuppression, hyperlipidemia, who presents to the emergency department for chief concerns of painless jaundice.   CT abd/pel w/ contrast outpatient: status post cholecystectomy.  Marked intra and extrahepatic biliary dilatation with interval pancreatic ductal dilatation, concerning for biliary obstruction.  Possible ill-defined hypodense mass at the uncinate process of pancreas though there is ill-defined soft tissue density as well as anticipated region of the ampulla.  Recommend correlation with ERCP.   EDP has called [facility], patient has been accepted and pending bed availability on 09/12/2022.   GI service, Dr. Mia Creek saw the patient in consultation and discussed with the patient that she will need ERCP plus or minus EUS.   09/13/2022: Was patient was started on broad-spectrum antibiotic by GI service.  EDP called [facility], and was told that patient is still on the wait list for transfer pending bed availability.  It was advised that patient can be admitted into the hospital without losing place in line pending bed availability.  Patient was admitted to the hospital service  7/25: No acute events.  Pending transfer    Assessment & Plan:   Principal Problem:   Elevated liver enzymes Active Problems:   History of constipation   History of colonic polyps   Hyperlipidemia   Hypertension   Hypothyroidism   Recurrent major depressive disorder, in full remission (HCC)   Sleep apnea   Total knee replacement status   Hypokalemia   DNR (do not resuscitate)   Asymptomatic bradycardia   Elevated serum creatinine   Elevated liver enzymes Suspect secondary to biliary obstruction in setting of concerning pancreatic mass GI service has been  consulted We do not have available ERCP services at [facility] currently Patient has been accepted for transfer to [facility]  Plan: Await available bed at [facility] for ERCP capable facility.  Continue diet as tolerated.  Continue IV fluids.  Trend LFTs.  Pain control as needed.  On IV Zosyn per GI recommendations.  Elevated serum creatinine Does not meet criteria for acute kidney injury at this time Continue IV fluids   Asymptomatic bradycardia Unchanged EKG compared to EKG in 09/09/2018.   DNR (do not resuscitate) Confirmed with patient at bedside with her spouse and daughter in the room   Hypokalemia Monitor and replete as necessary   Recurrent major depressive disorder, in full remission (HCC) Citalopram 20 mg daily; trazodone 50 mg nightly   Hypothyroidism Levothyroxine 137 mcg   Hypertension PTA Norvasc, losartan, Coreg resumed   History of constipation 1 watery bowel movement on the day of admission Senna Colace nightly.  MiraLAX 17 g twice daily   DVT prophylaxis: SQ heparin Code Status: DNR Family Communication: Daughter and spouse at bedside 7/25 Disposition Plan: Status is: Inpatient Remains inpatient appropriate because: Hyperbilirubinemia.  Suspicious pancreatic mass.  Pending bed availability at [facility].  Remains on transfer list.   Level of care: Telemetry Medical  Consultants:  GI Procedures:  None  Antimicrobials: Zosyn   Subjective: Seen and examined.  Pleasant.  Resting in bed.  No visible distress.  No complaints of pain.  Objective: Vitals:   09/13/22 2024 09/14/22 0433 09/14/22 0754 09/14/22 1121  BP: (!) 177/78 (!) 158/92 (!) 123/56 (!) 143/78  Pulse: (!) 51 (!) 54 70 (!) 54  Resp: 18 18 16 18   Temp: 98 F (36.7 C) 98.6 F (37 C) 98.2 F (36.8 C) 98.2 F (36.8 C)  TempSrc: Oral  Oral Oral  SpO2: 96% 95% 100% 95%  Weight: 83.9 kg     Height: 5\' 1"  (1.549 m)      No intake or output data in  the 24 hours ending 09/14/22 1252 Filed Weights   09/12/22 1627 09/13/22 2024  Weight: 82.1 kg 83.9 kg    Examination:  General exam: NAD Respiratory system: Clear to auscultation. Respiratory effort normal. Cardiovascular system: S1-S2, RRR, no murmurs, no pedal edema Gastrointestinal system: Soft, NT/ND, normal bowel sounds Central nervous system: Alert and oriented. No focal neurological deficits. Extremities: Symmetric 5 x 5 power. Skin: Diffusely jaundiced Psychiatry: Judgement and insight appear normal. Mood & affect appropriate.     Data Reviewed: I have personally reviewed following labs and imaging studies  CBC: Recent Labs  Lab 09/12/22 1926 09/13/22 1757 09/14/22 0319  WBC 10.3 10.3 9.9  NEUTROABS 7.6 8.0*  --   HGB 11.0* 11.1* 11.0*  HCT 33.3* 33.0* 31.4*  MCV 91.0 89.4 87.5  PLT 279 284 250   Basic Metabolic Panel: Recent Labs  Lab 09/12/22 1926 09/13/22 1757 09/14/22 0319  NA 136 133* 138  K 3.0* 3.0* 3.4*  CL 102 98 105  CO2 22 23 24   GLUCOSE 124* 216* 133*  BUN 23 25* 29*  CREATININE 1.10* 1.39* 1.11*  CALCIUM 8.8* 8.5* 8.7*  MG  --  2.1  --   PHOS  --  3.5  --    GFR: Estimated Creatinine Clearance: 40.4 mL/min (A) (by C-G formula based on SCr of 1.11 mg/dL (H)). Liver Function Tests: Recent Labs  Lab 09/12/22 1926 09/13/22 1757 09/14/22 0319  AST 205* 229* 209*  ALT 266* 279* 262*  ALKPHOS 315* 345* 328*  BILITOT 11.4* 12.7* 13.0*  PROT 7.0 6.9 6.8  ALBUMIN 3.3* 2.9* 2.6*   Recent Labs  Lab 09/12/22 1926  LIPASE 94*   No results for input(s): "AMMONIA" in the last 168 hours. Coagulation Profile: Recent Labs  Lab 09/12/22 1926  INR 1.1   Cardiac Enzymes: No results for input(s): "CKTOTAL", "CKMB", "CKMBINDEX", "TROPONINI" in the last 168 hours. BNP (last 3 results) No results for input(s): "PROBNP" in the last 8760 hours. HbA1C: No results for input(s): "HGBA1C" in the last 72 hours. CBG: No results for input(s):  "GLUCAP" in the last 168 hours. Lipid Profile: No results for input(s): "CHOL", "HDL", "LDLCALC", "TRIG", "CHOLHDL", "LDLDIRECT" in the last 72 hours. Thyroid Function Tests: Recent Labs    09/12/22 1926  TSH 2.321   Anemia Panel: No results for input(s): "VITAMINB12", "FOLATE", "FERRITIN", "TIBC", "IRON", "RETICCTPCT" in the last 72 hours. Sepsis Labs: No results for input(s): "PROCALCITON", "LATICACIDVEN" in the last 168 hours.  No results found for this or any previous visit (from the past 240 hour(s)).       Radiology Studies: CT ABDOMEN PELVIS W CONTRAST  Result Date: 09/12/2022 CLINICAL DATA:  Generalized abdominal pain EXAM: CT ABDOMEN AND PELVIS WITH CONTRAST TECHNIQUE: Multidetector CT imaging of the abdomen and pelvis was performed using the standard protocol following bolus administration of intravenous contrast. RADIATION DOSE REDUCTION: This exam was performed according to the departmental dose-optimization program which includes automated exposure control, adjustment of the mA and/or kV according to patient size and/or use of iterative reconstruction technique. CONTRAST:  80mL OMNIPAQUE IOHEXOL 300 MG/ML  SOLN COMPARISON:  CT 05/20/2020 FINDINGS: Lower  chest: Lung bases demonstrate no acute airspace disease. Normal cardiac size. Hepatobiliary: Status post cholecystectomy. Marked intra and extrahepatic biliary dilatation, common bile duct dilated up to 19 mm. Pancreas: No inflammation. Interval pancreatic ductal dilatation. Possible ill-defined soft tissue mass at the uncinate process versus ampullary region of duodenum, series 2, image 32 and series 2, image 34. Spleen: Normal in size without focal abnormality. Adrenals/Urinary Tract: Right adrenal gland is normal. 14 mm left adrenal nodule containing fat. Kidneys show no hydronephrosis. Small bilateral nonobstructing kidney stones. Multiple subcentimeter hypodensities too small to further characterize. The bladder is obscured by  artifact Stomach/Bowel: The stomach is nonenlarged. There is no dilated small bowel. No acute bowel wall thickening. Negative appendix. Diverticular disease of left colon. Vascular/Lymphatic: Mild aortic atherosclerosis. No aneurysm. No suspicious lymph nodes. Reproductive: Large calcified uterine fibroid Other: Negative for pelvic effusion or free air. Small fat containing umbilical hernia Musculoskeletal: Bilateral hip replacements with artifact. Multilevel degenerative changes of the spine. No acute osseous abnormality. IMPRESSION: 1. Status post cholecystectomy. Marked intra and extrahepatic biliary dilatation with interval pancreatic ductal dilatation, concerning for biliary obstruction. Possible ill-defined hypodense mass at the uncinate process of pancreas though there is ill-defined soft tissue density as well in the anticipated region of the ampulla. Recommend correlation with ERCP. 2. 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 3. Bilateral nonobstructing kidney stones. 4. Large calcified uterine fibroid. 5. Aortic atherosclerosis. 6. 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 No imaging follow-up is recommended. Aortic Atherosclerosis (ICD10-I70.0). Electronically Signed   By: Jasmine Pang M.D.   On: 09/12/2022 19:20        Scheduled Meds:  amLODipine  5 mg Oral Daily   Apremilast  30 mg Oral BID   carvedilol  6.25 mg Oral BID   citalopram  20 mg Oral Daily   heparin  5,000 Units Subcutaneous Q8H   levothyroxine  137 mcg Oral Q0600   losartan  100 mg Oral Daily   memantine  10 mg Oral BID   traZODone  50 mg Oral QHS   Continuous Infusions:  lactated ringers 125 mL/hr at 09/14/22 0320   piperacillin-tazobactam 3.375 g (09/14/22 0537)     LOS: 1 day    Tresa Moore, MD [facility]   If 7PM-7AM, please contact night-coverage  09/14/2022, 12:52 PM

## 2022-09-14 NOTE — Progress Notes (Signed)
Initial Nutrition Assessment  DOCUMENTATION CODES:   Non-severe (moderate) malnutrition in context of chronic illness  INTERVENTION:  - Add Boost Plus BID.   - Add Rena-vit q day.   NUTRITION DIAGNOSIS:   Moderate Malnutrition related to chronic illness as evidenced by energy intake < 75% for > or equal to 1 month.  GOAL:   Patient will meet greater than or equal to 90% of their needs  MONITOR:   PO intake, Supplement acceptance  REASON FOR ASSESSMENT:   Malnutrition Screening Tool    ASSESSMENT:   77 y.o. female admits related to jaundice biliary obstruction. PMH includes: HTN, depression, anxiety, insomnia, hypothyroid, OSA, HLD. Pt is currently receiving medical management related to elevated liver enzymes.  Meds reviewed: cozaar. Labs reviewed: K low, BUN/Creatinine elevated, alk phos elevated, AST/ALT elevated, total bilirubin elevated.   The pt reports that she was not eating well for about a month PTA. She states that overall she just had a poor appetite and the thought of food hurt her stomach. She states that since admission her appetite has returned. She reports that she has eaten most of her meals. Pt states that she has experienced about a 10 lb wt loss in the past month. This is a 5.4% wt loss in the past month which is significant. Pt is currently waiting to transfer to [facility]. RD will add Boost Plus to help supplement protein intake as well as adding a MVI. RD will continue to monitor PO intakes.   NUTRITION - FOCUSED PHYSICAL EXAM:  WDL - no wasting noted.   Diet Order:   Diet Order             Diet regular Room service appropriate? Yes; Fluid consistency: Thin  Diet effective now           Diet regular                   EDUCATION NEEDS:   Not appropriate for education at this time  Skin:  Skin Assessment: Reviewed RN Assessment  Last BM:  7/25  Height:   Ht Readings from Last 1 Encounters:  09/13/22 5\' 1"  (1.549 m)    Weight:   Wt  Readings from Last 1 Encounters:  09/13/22 83.9 kg    Ideal Body Weight:     BMI:  Body mass index is 34.95 kg/m.  Estimated Nutritional Needs:   Kcal:  1700-2100 kcals  Protein:  85-105 gm  Fluid:  >/= 1.7 L  Bethann Humble, RD, LDN, CNSC.

## 2022-09-14 NOTE — Discharge Summary (Addendum)
Physician Discharge Summary  Victoria Flynn:[id number] DOB: 06/29/1945 DOA: 09/12/2022  PCP: Lynnea Ferrier, MD  Admit date: 09/12/2022 Discharge date: 09/17/22  Admitted From: Home Disposition:  Transfer to [facility] Health:NA Equipment/Devices:None   Discharge Condition:Stable  CODE STATUS:DNR  Diet recommendation: NA  Brief/Interim Summary: 77 year old female with hypertension, depression, anxiety, insomnia, hypothyroid, obstructive sleep apnea, immunosuppression, hyperlipidemia, who presents to the emergency department for chief concerns of painless jaundice.    CT abd/pel w/ contrast outpatient: status post cholecystectomy.  Marked intra and extrahepatic biliary dilatation with interval pancreatic ductal dilatation, concerning for biliary obstruction.  Possible ill-defined hypodense mass at the uncinate process of pancreas though there is ill-defined soft tissue density as well as anticipated region of the ampulla.  Recommend correlation with ERCP.   EDP has called [facility], patient has been accepted and pending bed availability on 09/12/2022.   GI service, Dr. Mia Creek saw the patient in consultation and discussed with the patient that she will need ERCP plus or minus EUS.   09/13/2022: Was patient was started on broad-spectrum antibiotic by GI service.  EDP called [facility], and was told that patient is still on the wait list for transfer pending bed availability.  It was advised that patient can be admitted into the hospital without losing place in line pending bed availability.  Patient was admitted to the hospital service   7/25: No acute events.  Pending transfer Transfer appropriate once bed available at [facility]  7/28: Transferred once bed available at [facility]   Discharge Diagnoses:  Principal Problem:   Elevated liver enzymes Active Problems:   History of constipation   History of colonic polyps   Hyperlipidemia   Hypertension    Hypothyroidism   Recurrent major depressive disorder, in full remission (HCC)   Sleep apnea   Total knee replacement status   Hypokalemia   DNR (do not resuscitate)   Asymptomatic bradycardia   Elevated serum creatinine   Malnutrition of moderate degree   Elevated liver enzymes Suspect secondary to biliary obstruction in setting of concerning pancreatic mass GI service has been consulted We do not have available ERCP services at [facility] currently Patient has been accepted [facility]  Plan: Transfer to [facility] once bed available  Elevated serum creatinine Does not meet criteria for acute kidney injury at this time    Asymptomatic bradycardia Unchanged EKG compared to EKG in 09/09/2018.   DNR (do not resuscitate) Confirmed with patient at bedside with her spouse and daughter in the room   Hypokalemia Monitor and replete as necessary   Recurrent major depressive disorder, in full remission (HCC) Citalopram 20 mg daily; trazodone 50 mg nightly   Hypothyroidism Levothyroxine 137 mcg   Hypertension PTA Norvasc, losartan, Coreg resumed   History of constipation 1 watery bowel movement on the day of admission     Discharge Instructions  Discharge Instructions     Diet - low sodium heart healthy   Complete by: As directed    Diet regular   Complete by: As directed    Increase activity slowly   Complete by: As directed       Allergies as of 09/14/2022   No Active Allergies      Medication List     STOP taking these medications    betamethasone dipropionate 0.05 % cream   donepezil 5 MG tablet Commonly known as: ARICEPT   potassium chloride 10 MEQ tablet Commonly  known as: KLOR-CON M   Vtama 1 % Crea Generic drug: Tapinarof       TAKE these medications    acetaminophen 650 MG CR tablet Commonly known as: TYLENOL Take 1,300 mg by mouth 2 (two) times a day.   albuterol 108 (90 Base) MCG/ACT inhaler Commonly known  as: VENTOLIN HFA Inhale 2 puffs into the lungs every 6 (six) hours as needed.   amLODipine 5 MG tablet Commonly known as: NORVASC Take 5 mg by mouth daily.   BC HEADACHE POWDER PO Take 1 packet by mouth daily as needed (headache).   calcium-vitamin D 500-200 MG-UNIT tablet Commonly known as: OSCAL WITH D Take 1 tablet by mouth 2 (two) times daily.   carvedilol 6.25 MG tablet Commonly known as: COREG Take 6.25 mg by mouth 2 (two) times daily. What changed: Another medication with the same name was removed. Continue taking this medication, and follow the directions you see here.   citalopram 20 MG tablet Commonly known as: CELEXA Take 20 mg by mouth daily. What changed: Another medication with the same name was removed. Continue taking this medication, and follow the directions you see here.   clobetasol ointment 0.05 % Commonly known as: TEMOVATE Apply 1 application topically 2 (two) times a week.   Fish Oil 1000 MG Caps Take 1,000 mg by mouth daily.   galantamine 16 MG 24 hr capsule Commonly known as: RAZADYNE ER Take 16 mg by mouth daily.   hydrochlorothiazide 25 MG tablet Commonly known as: HYDRODIURIL Take 25 mg by mouth daily.   hydrocortisone 2.5 % cream Apply topically 3 (three) times a week. Apply to rash in groin 3 nights weekly, Tuesday, Thursday, Saturday prn flares   ketoconazole 2 % cream Commonly known as: NIZORAL Apply 1 Application topically as needed.   levothyroxine 137 MCG tablet Commonly known as: SYNTHROID Take 137 mcg by mouth every morning.   LORazepam 0.5 MG tablet Commonly known as: ATIVAN Take 1 mg by mouth at bedtime. For anxiety.   losartan 100 MG tablet Commonly known as: COZAAR Take 100 mg by mouth daily. What changed: Another medication with the same name was removed. Continue taking this medication, and follow the directions you see here.   MELATONIN MAXIMUM STRENGTH PO Take 5-15 mg by mouth at bedtime.   memantine 10 MG  tablet Commonly known as: NAMENDA Take 10 mg by mouth 2 (two) times daily. What changed: Another medication with the same name was removed. Continue taking this medication, and follow the directions you see here.   Otezla 30 MG Tabs Generic drug: Apremilast Take 1 tablet (30 mg total) by mouth 2 (two) times daily.   traZODone 50 MG tablet Commonly known as: DESYREL Take 50 mg by mouth daily.   triamcinolone 55 MCG/ACT Aero nasal inhaler Commonly known as: NASACORT Place 2 sprays into the nose daily.        No Active Allergies  Consultations: GI   Procedures/Studies: CT ABDOMEN PELVIS W CONTRAST  Result Date: 09/12/2022 CLINICAL DATA:  Generalized abdominal pain EXAM: CT ABDOMEN AND PELVIS WITH CONTRAST TECHNIQUE: Multidetector CT imaging of the abdomen and pelvis was performed using the standard protocol following bolus administration of intravenous contrast. RADIATION DOSE REDUCTION: This exam was performed according to the departmental dose-optimization program which includes automated exposure control, adjustment of the mA and/or kV according to patient size and/or use of iterative reconstruction technique. CONTRAST:  80mL OMNIPAQUE IOHEXOL 300 MG/ML  SOLN COMPARISON:  CT 05/20/2020 FINDINGS: Lower  chest: Lung bases demonstrate no acute airspace disease. Normal cardiac size. Hepatobiliary: Status post cholecystectomy. Marked intra and extrahepatic biliary dilatation, common bile duct dilated up to 19 mm. Pancreas: No inflammation. Interval pancreatic ductal dilatation. Possible ill-defined soft tissue mass at the uncinate process versus ampullary region of duodenum, series 2, image 32 and series 2, image 34. Spleen: Normal in size without focal abnormality. Adrenals/Urinary Tract: Right adrenal gland is normal. 14 mm left adrenal nodule containing fat. Kidneys show no hydronephrosis. Small bilateral nonobstructing kidney stones. Multiple subcentimeter hypodensities too small to  further characterize. The bladder is obscured by artifact Stomach/Bowel: The stomach is nonenlarged. There is no dilated small bowel. No acute bowel wall thickening. Negative appendix. Diverticular disease of left colon. Vascular/Lymphatic: Mild aortic atherosclerosis. No aneurysm. No suspicious lymph nodes. Reproductive: Large calcified uterine fibroid Other: Negative for pelvic effusion or free air. Small fat containing umbilical hernia Musculoskeletal: Bilateral hip replacements with artifact. Multilevel degenerative changes of the spine. No acute osseous abnormality. IMPRESSION: 1. Status post cholecystectomy. Marked intra and extrahepatic biliary dilatation with interval pancreatic ductal dilatation, concerning for biliary obstruction. Possible ill-defined hypodense mass at the uncinate process of pancreas though there is ill-defined soft tissue density as well in the anticipated region of the ampulla. Recommend correlation with ERCP. 2. 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 3. Bilateral nonobstructing kidney stones. 4. Large calcified uterine fibroid. 5. Aortic atherosclerosis. 6. 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 No imaging follow-up is recommended. Aortic Atherosclerosis (ICD10-I70.0). Electronically Signed   By: Jasmine Pang M.D.   On: 09/12/2022 19:20      Subjective: Seen and examined on day of DC.  Appropriate for transfer to tertiary care facility.  Discharge Exam: Vitals:   09/14/22 1121 09/14/22 1601  BP: (!) 143/78 (!) 148/66  Pulse: (!) 54 60  Resp: 18 17  Temp: 98.2 F (36.8 C) 97.8 F (36.6 C)  SpO2: 95% 96%   Vitals:   09/14/22 0433 09/14/22 0754 09/14/22 1121 09/14/22 1601  BP: (!) 158/92 (!) 123/56 (!) 143/78 (!) 148/66  Pulse: (!) 54 70 (!) 54 60  Resp: 18 16 18 17   Temp: 98.6 F (37 C) 98.2 F (36.8 C) 98.2 F (36.8 C) 97.8 F (36.6 C)  TempSrc:  Oral Oral Oral  SpO2: 95% 100% 95% 96%   Weight:      Height:        General: Pt is alert, awake, not in acute distress Cardiovascular: RRR, S1/S2 +, no rubs, no gallops Respiratory: CTA bilaterally, no wheezing, no rhonchi Abdominal: Soft, NT, ND, bowel sounds + Extremities: no edema, no cyanosis Skin: Diffuse jaundice    The results of significant diagnostics from this hospitalization (including imaging, microbiology, ancillary and laboratory) are listed below for reference.     Microbiology: No results found for this or any previous visit (from the past 240 hour(s)).   Labs: BNP (last 3 results) No results for input(s): "BNP" in the last 8760 hours. Basic Metabolic Panel: Recent Labs  Lab 09/12/22 1926 09/13/22 1757 09/14/22 0319  NA 136 133* 138  K 3.0* 3.0* 3.4*  CL 102 98 105  CO2 22 23 24   GLUCOSE 124* 216* 133*  BUN 23 25* 29*  CREATININE 1.10* 1.39* 1.11*  CALCIUM 8.8* 8.5* 8.7*  MG  --  2.1  --   PHOS  --  3.5  --    Liver Function Tests: Recent Labs  Lab 09/12/22 1926  09/13/22 1757 09/14/22 0319  AST 205* 229* 209*  ALT 266* 279* 262*  ALKPHOS 315* 345* 328*  BILITOT 11.4* 12.7* 13.0*  PROT 7.0 6.9 6.8  ALBUMIN 3.3* 2.9* 2.6*   Recent Labs  Lab 09/12/22 1926  LIPASE 94*   No results for input(s): "AMMONIA" in the last 168 hours. CBC: Recent Labs  Lab 09/12/22 1926 09/13/22 1757 09/14/22 0319  WBC 10.3 10.3 9.9  NEUTROABS 7.6 8.0*  --   HGB 11.0* 11.1* 11.0*  HCT 33.3* 33.0* 31.4*  MCV 91.0 89.4 87.5  PLT 279 284 250   Cardiac Enzymes: No results for input(s): "CKTOTAL", "CKMB", "CKMBINDEX", "TROPONINI" in the last 168 hours. BNP: Invalid input(s): "POCBNP" CBG: No results for input(s): "GLUCAP" in the last 168 hours. D-Dimer No results for input(s): "DDIMER" in the last 72 hours. Hgb A1c No results for input(s): "HGBA1C" in the last 72 hours. Lipid Profile No results for input(s): "CHOL", "HDL", "LDLCALC", "TRIG", "CHOLHDL", "LDLDIRECT" in the last 72  hours. Thyroid function studies Recent Labs    09/12/22 1926  TSH 2.321   Anemia work up No results for input(s): "VITAMINB12", "FOLATE", "FERRITIN", "TIBC", "IRON", "RETICCTPCT" in the last 72 hours. Urinalysis    Component Value Date/Time   COLORURINE YELLOW (A) 09/13/2018 1404   APPEARANCEUR CLEAR (A) 09/13/2018 1404   APPEARANCEUR Hazy 05/31/2011 1332   LABSPEC 1.018 09/13/2018 1404   LABSPEC 1.016 05/31/2011 1332   PHURINE 5.0 09/13/2018 1404   GLUCOSEU NEGATIVE 09/13/2018 1404   GLUCOSEU Negative 05/31/2011 1332   HGBUR NEGATIVE 09/13/2018 1404   BILIRUBINUR NEGATIVE 09/13/2018 1404   BILIRUBINUR Negative 05/31/2011 1332   KETONESUR NEGATIVE 09/13/2018 1404   PROTEINUR NEGATIVE 09/13/2018 1404   NITRITE NEGATIVE 09/13/2018 1404   LEUKOCYTESUR NEGATIVE 09/13/2018 1404   LEUKOCYTESUR Negative 05/31/2011 1332   Sepsis Labs Recent Labs  Lab 09/12/22 1926 09/13/22 1757 09/14/22 0319  WBC 10.3 10.3 9.9   Microbiology No results found for this or any previous visit (from the past 240 hour(s)).   Time coordinating discharge: Over 30 minutes  SIGNED:   Tresa Moore, MD  [facility] 09/17/22 Pager   If 7PM-7AM, please contact night-coverage

## 2022-09-15 LAB — COMPREHENSIVE METABOLIC PANEL
ALT: 265 U/L — ABNORMAL HIGH (ref 0–44)
AST: 222 U/L — ABNORMAL HIGH (ref 15–41)
Albumin: 2.8 g/dL — ABNORMAL LOW (ref 3.5–5.0)
Alkaline Phosphatase: 390 U/L — ABNORMAL HIGH (ref 38–126)
Anion gap: 12 (ref 5–15)
BUN: 28 mg/dL — ABNORMAL HIGH (ref 8–23)
CO2: 22 mmol/L (ref 22–32)
Calcium: 8.6 mg/dL — ABNORMAL LOW (ref 8.9–10.3)
Chloride: 101 mmol/L (ref 98–111)
Creatinine, Ser: 0.89 mg/dL (ref 0.44–1.00)
GFR, Estimated: >60 mL/min (ref >60)
Glucose, Bld: 173 mg/dL — ABNORMAL HIGH (ref 70–99)
Potassium: 3.2 mmol/L — ABNORMAL LOW (ref 3.5–5.1)
Sodium: 135 mmol/L (ref 135–145)
Total Bilirubin: 15.1 mg/dL — ABNORMAL HIGH (ref 0.3–1.2)
Total Protein: 6.6 g/dL (ref 6.5–8.1)

## 2022-09-15 MED ORDER — LORAZEPAM 1 MG PO TABS
1.0000 mg | ORAL_TABLET | Freq: Every day | ORAL | Status: DC
  Administered 2022-09-15 – 2022-09-16 (×2): 1 mg via ORAL
  Filled 2022-09-15 (×2): qty 1

## 2022-09-15 MED ORDER — GALANTAMINE HYDROBROMIDE ER 8 MG PO CP24
16.0000 mg | ORAL_CAPSULE | Freq: Every day | ORAL | Status: DC
  Administered 2022-09-15 – 2022-09-17 (×3): 16 mg via ORAL
  Filled 2022-09-15 (×3): qty 2

## 2022-09-15 MED ORDER — TRIAMCINOLONE ACETONIDE 55 MCG/ACT NA AERO
2.0000 | INHALATION_SPRAY | Freq: Every day | NASAL | Status: DC
  Administered 2022-09-15 – 2022-09-17 (×2): 2 via NASAL
  Filled 2022-09-15: qty 21.6

## 2022-09-15 MED ORDER — ALBUTEROL SULFATE (2.5 MG/3ML) 0.083% IN NEBU: 3.0000 mL | INHALATION_SOLUTION | Freq: Four times a day (QID) | RESPIRATORY_TRACT | Status: DC | PRN

## 2022-09-15 MED ORDER — POTASSIUM CHLORIDE CRYS ER 20 MEQ PO TBCR
40.0000 meq | EXTENDED_RELEASE_TABLET | Freq: Once | ORAL | Status: AC
  Administered 2022-09-15: 40 meq via ORAL
  Filled 2022-09-15: qty 2

## 2022-09-15 MED ORDER — HYDROCHLOROTHIAZIDE 25 MG PO TABS
25.0000 mg | ORAL_TABLET | Freq: Every day | ORAL | Status: DC
  Administered 2022-09-15 – 2022-09-17 (×3): 25 mg via ORAL
  Filled 2022-09-15 (×3): qty 1

## 2022-09-15 MED ORDER — MELATONIN 5 MG PO TABS
10.0000 mg | ORAL_TABLET | Freq: Every day | ORAL | Status: DC
  Administered 2022-09-15 – 2022-09-16 (×2): 10 mg via ORAL
  Filled 2022-09-15 (×2): qty 2

## 2022-09-15 NOTE — Progress Notes (Addendum)
Brief progress note  Patient seen and examined.  No status changes.  Remains on waiting list for transfer to [facility].  Lolita Patella MD  No charge

## 2022-09-15 NOTE — Care Management Important Message (Signed)
Important Message  Patient Details  Name: TRAN FARISH MRN: [id number] Date of Birth: 08/31/1945   Medicare Important Message Given:  N/A - LOS <3 / Initial given by admissions     Olegario Messier A Javar Eshbach 09/15/2022, 9:25 AM

## 2022-09-16 NOTE — Plan of Care (Signed)
  Problem: Education: Goal: Knowledge of General Education information will improve Description: Including pain rating scale, medication(s)/side effects and non-pharmacologic comfort measures Outcome: Progressing   Problem: Clinical Measurements: Goal: Will remain free from infection Outcome: Progressing Goal: Respiratory complications will improve Outcome: Progressing Goal: Cardiovascular complication will be avoided Outcome: Progressing   Problem: Activity: Goal: Risk for activity intolerance will decrease Outcome: Progressing   Problem: Nutrition: Goal: Adequate nutrition will be maintained Outcome: Progressing   Problem: Coping: Goal: Level of anxiety will decrease Outcome: Progressing   Problem: Elimination: Goal: Will not experience complications related to bowel motility Outcome: Progressing   Problem: Pain Managment: Goal: General experience of comfort will improve Outcome: Progressing   Problem: Safety: Goal: Ability to remain free from injury will improve Outcome: Progressing

## 2022-09-16 NOTE — Progress Notes (Signed)
Brief progress note.  Nonbillable note.  Patient seen and examined.  Continues to be on waiting list for transfer to [facility].  Bedside RN on 7/26 received a call and gave signout to the accepting facility.  Expected pickup within the next 1 to 2 days.  Lolita Patella MD  No charge

## 2022-09-16 NOTE — Plan of Care (Signed)
Transfer center just called for updates; awaiting a bed at [facility] to open, plan to d/c tomorrow with ERCP possibly Monday or Tuesday

## 2022-09-16 NOTE — Plan of Care (Signed)

## 2022-09-17 DIAGNOSIS — R748 Abnormal levels of other serum enzymes: Secondary | ICD-10-CM | POA: Diagnosis not present

## 2022-09-17 MED ORDER — ALPRAZOLAM 0.25 MG PO TABS
0.2500 mg | ORAL_TABLET | Freq: Three times a day (TID) | ORAL | Status: DC | PRN
  Administered 2022-09-17: 0.25 mg via ORAL
  Filled 2022-09-17: qty 1

## 2022-09-17 NOTE — Plan of Care (Signed)
  Problem: Education: Goal: Knowledge of General Education information will improve Description: Including pain rating scale, medication(s)/side effects and non-pharmacologic comfort measures 09/17/2022 1547 by Harrietta Guardian, RN Outcome: Adequate for Discharge 09/17/2022 1216 by Harrietta Guardian, RN Outcome: Progressing   Problem: Health Behavior/Discharge Planning: Goal: Ability to manage health-related needs will improve 09/17/2022 1547 by Harrietta Guardian, RN Outcome: Adequate for Discharge 09/17/2022 1216 by Harrietta Guardian, RN Outcome: Progressing   Problem: Clinical Measurements: Goal: Ability to maintain clinical measurements within normal limits will improve 09/17/2022 1547 by Harrietta Guardian, RN Outcome: Adequate for Discharge 09/17/2022 1216 by Harrietta Guardian, RN Outcome: Progressing Goal: Will remain free from infection 09/17/2022 1547 by Harrietta Guardian, RN Outcome: Adequate for Discharge 09/17/2022 1216 by Harrietta Guardian, RN Outcome: Progressing Goal: Diagnostic test results will improve 09/17/2022 1547 by Harrietta Guardian, RN Outcome: Adequate for Discharge 09/17/2022 1216 by Harrietta Guardian, RN Outcome: Progressing   Problem: Activity: Goal: Risk for activity intolerance will decrease 09/17/2022 1547 by Harrietta Guardian, RN Outcome: Adequate for Discharge 09/17/2022 1216 by Harrietta Guardian, RN Outcome: Progressing   Problem: Nutrition: Goal: Adequate nutrition will be maintained 09/17/2022 1547 by Harrietta Guardian, RN Outcome: Adequate for Discharge 09/17/2022 1216 by Harrietta Guardian, RN Outcome: Progressing   Problem: Coping: Goal: Level of anxiety will decrease 09/17/2022 1547 by Harrietta Guardian, RN Outcome: Adequate for Discharge 09/17/2022 1216 by Harrietta Guardian, RN Outcome: Progressing   Problem: Elimination: Goal: Will not experience complications related to bowel motility 09/17/2022 1547 by Harrietta Guardian, RN Outcome: Adequate for Discharge 09/17/2022  1216 by Harrietta Guardian, RN Outcome: Progressing   Problem: Pain Managment: Goal: General experience of comfort will improve 09/17/2022 1547 by Harrietta Guardian, RN Outcome: Adequate for Discharge 09/17/2022 1216 by Harrietta Guardian, RN Outcome: Progressing   Problem: Safety: Goal: Ability to remain free from injury will improve 09/17/2022 1547 by Harrietta Guardian, RN Outcome: Adequate for Discharge 09/17/2022 1216 by Harrietta Guardian, RN Outcome: Progressing   Problem: Skin Integrity: Goal: Risk for impaired skin integrity will decrease 09/17/2022 1547 by Harrietta Guardian, RN Outcome: Adequate for Discharge 09/17/2022 1216 by Harrietta Guardian, RN Outcome: Progressing

## 2022-09-17 NOTE — Plan of Care (Signed)

## 2022-09-17 NOTE — Plan of Care (Signed)
  Problem: Education: Goal: Knowledge of General Education information will improve Description: Including pain rating scale, medication(s)/side effects and non-pharmacologic comfort measures Outcome: Progressing   Problem: Health Behavior/Discharge Planning: Goal: Ability to manage health-related needs will improve Outcome: Progressing   Problem: Clinical Measurements: Goal: Will remain free from infection Outcome: Progressing   Problem: Activity: Goal: Risk for activity intolerance will decrease Outcome: Progressing   Problem: Nutrition: Goal: Adequate nutrition will be maintained Outcome: Progressing   Problem: Coping: Goal: Level of anxiety will decrease Outcome: Progressing   Problem: Elimination: Goal: Will not experience complications related to bowel motility Outcome: Progressing   Problem: Pain Managment: Goal: General experience of comfort will improve Outcome: Progressing   

## 2022-09-17 NOTE — Progress Notes (Signed)
Telephone Call to [facility] [phone] to clarify if report had been called regarding transfer of this pt from [facility] Room 130 to [facility] [phone]; spoke with Dominga Ferry, she said she has not received any report; advised Dominga Ferry that this pt's RN would be calling her with report

## 2022-09-17 NOTE — Progress Notes (Signed)
Victoria Flynn with [facility] called to advise that the BLS truck should be here in 30 minutes

## 2022-09-17 NOTE — Progress Notes (Signed)
Received call from transfer center with bed assignment and accepting MD. Will report to oncoming nurse.

## 2022-09-17 NOTE — Progress Notes (Signed)
Telephone call to [facility] [phone] to verify if transfer transportation had been previously arranged; spoke with Greig Castilla who advised that no one had called regarding nonemergency transport of pt from [facility] Room 130 to [facility] [phone]; added pt to the list for BLS ground transportation; Greig Castilla advised it could take several hours for transport

## 2022-12-13 ENCOUNTER — Ambulatory Visit: Payer: Medicare PPO

## 2022-12-13 DIAGNOSIS — Z23 Encounter for immunization: Secondary | ICD-10-CM

## 2022-12-13 DIAGNOSIS — Z719 Counseling, unspecified: Secondary | ICD-10-CM

## 2022-12-13 NOTE — Progress Notes (Signed)
In nurse clinic for immunizations. Patient requesting Covid Comirnaty 12+ vaccine. Voices no concerns. VIS reviewed and given to patient. Vaccine tolerated well; no issues noted. Patient monitored for 15 minutes; no problems. [facility] updated and copy given to patient.  Abagail Kitchens, RN

## 2023-01-11 NOTE — H&P (View-Only) (Signed)
 Surgical Oncology/General Surgery Follow-up Visit  01/15/2023  Care Team: Patient Care Team: Victoria Flynn DOUGLAS, MD as PCP - General (Internal Medicine)  Diagnosis:   ICD-10-CM   1. Adenocarcinoma of pancreatic duct (CMS/HHS-HCC)  C25.3 sodium chloride  0.9% flush inj syringe 5 mL    ceFAZolin  in dextrose  (ANCEF ) IVPB 2 g/50 mL    metroNIDAZOLE in NaCl (FLAGYL) IVPB 500 mg    heparin  (porcine) 5,000 unit/mL injection 5,000 Units    AMB REF TO [facility] PAT PRE-OPERATIVE SCREENING     Procedure: n/a  Reason for visit/chief complaint: Follow-up  HPI: Victoria Flynn is a pleasant 77 y.o. female seen in follow-up for pancreatic cancer, complicated by biliary obstruction, sp ERCP and CBD stent. History is notable for hypertension, hyperlipidemia, hypothyroidism, obstructive sleep apnea (uses CPAP), depression, prediabetes, emphysema, psoriasis (otezla ), lung nodules and arthritis.   She presented to [facility] on 7/28/204 with jaundice associated with other concerning symptoms. Imaging showed biliary dilation with pancreatic mass. Elevated tumor markers. Underwent EUS/ERCP noted to have mass in the uncinate process, biopsied, path consistent with adenocarcinoma, duodenal invasion, path proven, biliary stricture in lower third of main bile duct, biliary sphincterotomy performed along with placement of fully covered metal stent in CBD on 09/19/2022. Improvement in LFTs noted. Surgical Oncology consulted and will need follow up with medical oncology for chemotherapy prior to surgery.   She is followed by Dr. Jingquan Flynn with [facility], initiated neoadjuvant Gem/Abraxane 10/27/2022 with dose reduction of abraxane, with her final neoadjuvant dose on 01/05/2023, CA 19-9 was 43.27, A1c 7.1%, LFTs were normal.    She continues on otezla  twice a day for psoriasis, fish oil 1000 mg daily, not on oxycodone , nasocort as needed.  Since last evaluation, [facility] Flynn has been doing well overall stools are dark but  not oily or greasy, appetite is improving over the past month, 2-3 supplements/day. Is walking outside several times a week and some tasks around the house. has not had any reflux, nausea, vomiting, abdominal pain.   Of note has required narcan after procedures in the past.   Overall, she has tolerated 3 months of neoadjuvant Gem/Abraxane well.  She presents today after repeat imaging to discuss surgery.  There has been no interval change in their past medical or surgical history.  Medications: Personally reviewed Current Outpatient Medications  Medication Sig Dispense Refill  . acetaminophen  (TYLENOL ) 650 MG ER tablet Take 1,300 mg by mouth 2 (two) times daily as needed    . albuterol  MDI, PROVENTIL , VENTOLIN , PROAIR , HFA 90 mcg/actuation inhaler Inhale 2 inhalations into the lungs every 6 (six) hours as needed for Wheezing 1 each 1  . amLODIPine  (NORVASC ) 5 MG tablet Take 1 tablet (5 mg total) by mouth 2 (two) times daily 180 tablet 3  . apremilast  (OTEZLA ) 30 mg tablet Take 30 mg by mouth 2 (two) times daily.    SABRA aspirin 81 MG EC tablet Take 81 mg by mouth once daily.    . CALCIUM  CARBONATE-VITAMIN D3 ORAL Take 1 tablet by mouth once daily 600 mg    . carvediloL  (COREG ) 6.25 MG tablet Take 1 tablet (6.25 mg total) by mouth 2 (two) times daily with meals 60 tablet 0  . citalopram  (CELEXA ) 20 MG tablet Take 1 tablet (20 mg total) by mouth once daily 90 tablet 1  . galantamine  (RAZADYNE  ER) 16 MG ER capsule Take 1 capsule (16 mg total) by mouth daily with breakfast 90 capsule 3  .  levothyroxine  (SYNTHROID ) 137 MCG tablet TAKE ONE TABLET DAILY ON EMPTY STOMACH. WAIT 30 MINUTES BEFORE TAKING OTHER MEDS. 90 tablet 0  . LORazepam  (ATIVAN ) 0.5 MG tablet Takes 2 at night. Takes additional one tablet if she still hasn't gone to sleep 2-3 hours later 90 tablet 0  . losartan  (COZAAR ) 100 MG tablet Take 1 tablet (100 mg total) by mouth once daily 90 tablet 3  . melatonin 10 mg Tab Take 10 mg by mouth at  bedtime    . memantine  (NAMENDA ) 10 MG tablet Take 1 tablet (10 mg total) by mouth 2 (two) times daily for 360 days 180 tablet 3  . OMEGA-3 FATTY ACIDS-FISH OIL ORAL Take 1 capsule by mouth once daily 1000 mg    . oxyCODONE  (ROXICODONE ) 5 MG immediate release tablet Take 1 tablet (5 mg total) by mouth every 6 (six) hours as needed for Pain 7 tablet 0  . traZODone  (DESYREL ) 50 MG tablet Take 2 tablets (100 mg total) by mouth at bedtime as needed for Sleep 180 tablet 1  . triamcinolone  (NASACORT  AQ) 55 mcg nasal spray Place 2 sprays into both nostrils once daily     No current facility-administered medications for this visit.    ROS:  Complete ROS was done, all pertinent/positives/negatives listed in HPI, all other systems negative.  Physical exam: BP 138/70 (BP Location: Left upper arm, Patient Position: Sitting)   Pulse 71   Temp 36.4 C (97.5 F) (Oral)   Resp 16   Ht 152.4 cm (5')   Wt 80 kg (176 lb 5.9 oz)   LMP  (LMP Unknown)   SpO2 96%   BMI 34.44 kg/m  ECOG: Symptomatic; fully ambulatory General appearance: alert, appears stated age, and cooperative Eyes: EOMI, no scleral icterus Lungs: clear to auscultation bilaterally Heart: regular rate and rhythm, S1, S2 normal, no murmur, click, rub or gallop Abdomen: soft, non-tender Extremities: extremities normal, atraumatic, no cyanosis or edema  Labs: personally reviewed  Ancillary Orders on 01/15/2023  Component Date Value Ref Range Status  . WBC (White Blood Cell Count) 01/15/2023 10.2 (H)  3.2 - 9.8 x10^9/L Final  . Hemoglobin 01/15/2023 10.0 (L)  11.7 - 15.5 g/dL Final  . Hematocrit 88/74/7975 31.9 (L)  35.0 - 45.0 % Final  . Platelets 01/15/2023 253  150 - 450 x10^9/L Final  . MCV (Mean Corpuscular Volume) 01/15/2023 93  80 - 98 fL Final  . MCH (Mean Corpuscular Hemoglobin) 01/15/2023 29.2  26.5 - 34.0 pg Final  . MCHC (Mean Corpuscular Hemoglobin * 01/15/2023 31.3  31.0 - 36.0 % Final  . RBC (Red Blood Cell Count)  01/15/2023 3.43 (L)  3.77 - 5.16 x10^12/L Final  . RDW-CV (Red Cell Distribution Widt* 01/15/2023 15.8 (H)  11.5 - 14.5 % Final  . NRBC (Nucleated Red Blood Cell Cou* 01/15/2023 0.00  0 x10^9/L Final  . NRBC % (Nucleated Red Blood Cell %) 01/15/2023 0.0  % Final  . MPV (Mean Platelet Volume) 01/15/2023 9.7  7.2 - 11.7 fL Final  . Cancer Antigen 19-9 (CA 19-9) 01/15/2023 35  <=35 U/mL Final  . CEA (Carcinoembryonic Antigen) 01/15/2023 6.5 (H)  <=2.5 ng/mL Final  . Sodium 01/15/2023 137  135 - 145 mmol/L Final  . Potassium 01/15/2023 4.1  3.5 - 5.0 mmol/L Final  . Chloride 01/15/2023 101  98 - 108 mmol/L Final  . Carbon Dioxide (CO2) 01/15/2023 27  21 - 30 mmol/L Final  . Urea Nitrogen (BUN) 01/15/2023 23 (H)  7 -  20 mg/dL Final  . Creatinine 88/74/7975 0.8  0.4 - 1.0 mg/dL Final  . Glucose 88/74/7975 84  70 - 140 mg/dL Final  . Calcium  01/15/2023 9.0  8.7 - 10.2 mg/dL Final  . AST (Aspartate Aminotransferase) 01/15/2023 23  15 - 41 U/L Final  . ALT (Alanine Aminotransferase) 01/15/2023 31  10 - 39 U/L Final  . Bilirubin, Total 01/15/2023 0.3 (L)  0.4 - 1.5 mg/dL Final  . Alk Phos (Alkaline Phosphatase) 01/15/2023 60  24 - 110 U/L Final  . Albumin 01/15/2023 3.4 (L)  3.5 - 4.8 g/dL Final  . Protein, Total 01/15/2023 7.1  6.2 - 8.1 g/dL Final  . Anion Gap 88/74/7975 9  3 - 12 mmol/L Final  . BUN/CREA Ratio 01/15/2023 29 (H)  6 - 27 Final  . Glomerular Filtration Rate (eGFR)  01/15/2023 75  mL/min/1.73sq m Final   Imaging: Personally reviewed CT dual pancreas incl CT abd pel w and CT chest w MIPS Procedure: CT Chest with IV Contrast  Procedure: CT Abdomen and Pelvis with IV Contrast  Comparison:  CT chest abdomen and pelvis 09/18/2022. CT abdomen and pelvis 09/21/2022..  Indication:  pancreatic cancer, C25.3 Malignant neoplasm of pancreatic duct (CMS/HHS-HCC).   Technique:  CT imaging of the chest, abdomen, and pelvis was performed with IV contrast. A pancreatic protocol CT was  performed, with pancreatic arterial phase imaging of the abdomen and portal venous phase imaging of the chest, abdomen, and pelvis.   Iodinated contrast was used due to the indications for the examination, to improve disease detection and to further define anatomy. Coronal and sagittal reformatted images were generated and reviewed. 3-D maximum intensity projection (MIP) reconstructions of the chest were performed to potentially increase study sensitivity.  Findings: Chest:   - Chest wall and Thoracic Inlet: No masses or lymphadenopathy. Right chest wall port central venous catheter tip terminates at superior cavoatrial junction.  - Mediastinum and Hila: No masses or lymphadenopathy.  - Thoracic Vessels: Normal air ascending aortic ectasia measuring up to 4.0 cm. Normal caliber main pulmonary artery. Mild atherosclerotic plaque.  - Heart and Pericardium: Normal heart size.  No pericardial effusion.  - Lungs and Airways: The central airways are patent and areas of mucoid impaction in the right middle and lower lobes. Similar scattered bilateral small parenchymal nodules. Representative right lower lobe nodule measures 5 mm (series 6, image 26), unchanged and a representative left lower lobe nodule measures 4 mm (series 6, image 257), unchanged. No new suspicious nodules.  - Pleura: No pleural effusions.   Abdomen and pelvis:  - Liver: Geographic areas of arterial phase hyper. Biliary distribution as well as within the inferior right hepatic lobe, likely perfusional. No suspicious hepatic lesion identified.  The portal and hepatic veins are patent.   - Biliary and Gallbladder: Similar to slightly decreased diffuse intrahepatic biliary ductal dilatation with scattered pneumobilia. Table position of the metallic CBD stent.  - Spleen: Normal in appearance.    - Pancreas: Overall similar appearance of the ill-defined mass within the pancreatic head which is difficult to  measure. The degree of peripancreatic fat stranding is decreased compared to 09/21/2022. Similar upstream ductal dilatation and glandular atrophy.   Local staging for pancreatic lesion:  *  Celiac axis: Not involved. *  Common hepatic artery: Not involved. *  Superior mesenteric artery: Not involved. *  Arterial anatomic variants: None. *  Portal vein/superior mesenteric vein: Short segment abutment without distortion. *  Splenic vein: Patent. *  Pancreatic  duct measures 6 mm in the body, and mm in the tail.  - Adrenal Glands: Normal right adrenal gland. Unchanged left adrenal myelolipoma.   - Kidneys: Symmetric in size and enhancement. Innumerable bilateral subcentimeter hypoattenuating lesions too small to characterize. Bilateral punctate nonobstructing renal calculi. No suspicious renal lesions. No hydronephrosis.  - Abdominal and Pelvic Vasculature: No abdominal aortic aneurysm. Mild atherosclerotic plaque.  - Gastrointestinal Tract: No abnormal dilation or wall thickening. Colonic diverticulosis. Normal appendix.  - Peritoneum/Mesentery/Retroperitoneum: No free fluid.  No free intraperitoneal air.  - Lymph Nodes: No pathologically enlarged lymph nodes. Similar mildly prominent periportal and gastrohepatic ligament lymph nodes.   - Bladder: Largely obscured by streak artifact from bilateral hip arthroplasty hardware.  - Pelvic Organs: Calcified uterine fibroids.  - Body Wall: Unremarkable.  - Musculoskeletal:  No aggressive appearing osseous lesions. Views osseous demineralization. Multilevel degenerative changes of the spine. Bilateral total hip arthroplasty.  Impression: 1.  Similar ill-defined pancreatic head mass with upstream dilation and short segment abutment of the SMV without distortion. 2.  Unchanged small bilateral pulmonary nodules. 3.  No evidence of new or enlarging metastatic disease within the chest, abdomen or pelvis.  Electronically Reviewed by:   Venetia Schanz, DO, [facility] Electronically Reviewed on:  01/15/2023 1:27 PM  I have reviewed the images and concur with the above findings.  Electronically Signed by:  Toya Irving, MD, [facility] Electronically Signed on:  01/15/2023 2:32 PM  Assessment/Plan   ICD-10-CM   1. Adenocarcinoma of pancreatic duct (CMS/HHS-HCC)  C25.3 sodium chloride  0.9% flush inj syringe 5 mL    ceFAZolin  in dextrose  (ANCEF ) IVPB 2 g/50 mL    metroNIDAZOLE in NaCl (FLAGYL) IVPB 500 mg    heparin  (porcine) 5,000 unit/mL injection 5,000 Units    AMB REF TO [facility] PAT PRE-OPERATIVE SCREENING     [facility] returns for follow-up of pancreatic cancer  We appreciate the opportunity to participate in their care.   MICHELLE CHRISTELLA CULLENS, NP  Attestation Statement:   I personally saw the patient and performed a substantive portion of the medical decision making, in conjunction with the Advanced Practice Provider for the condition/treatment of pancreatic cancer.  77yo woman who has responded well to 3 mos NAC for PDAC, with normalization of CA 19-9.  Still with abutment of SMV at location of first jejunal vein.  Given biochemical response, I am not sure that we would gain much by continuing NAC for another 3 months.  The patient is agreeable to proceed with surgery at this point, knowing that she will receive another 3 months of chemo postop.  We discussed the organs that are removed with the Whipple procedure (gastric antrum, duodenum, head of the pancreas (with the distal bile duct), and gallbladder as well as the required connections between the jejunum and pancreas, bile duct, and stomach. We also reviewed risks of surgery, to include pain, scarring, bleeding, infection, anastomotic leak (PJ > HJ > GJ), pancreatic exocrine and endocrine insufficiency, delayed gastric emptying, as well as need for further procedures.   All of her questions and those of her family were answered, and she agrees to  proceed.  Carleton Ip, MD Surgical Oncology    01/16/2023 1631 Call to Ms. Patalano advised of surgery date 02/08/2023 and she accepted the same. Case posted. Aware that will need in person PAT appointment, she has CPAP machine which she will bring on day of surgery. Aware to call with questions. Rosaline CULLENS FNP-BC

## 2023-01-11 NOTE — Progress Notes (Signed)
 [facility] is evaluated for f/u of pancreatic adenocarcinoma, presenting with biliary obstruction 7/24; has hyperlipidemia, hypothyroidism, hypertension, sleep apnea, depression, prediabetes, emphysema, arthritis.  S/p biliary stent, and being followed at [facility] and [facility].  Has just completed neoadjuvant chemotherapy, and fortunately did well with this.  She has been eating, drinking, and has been taking about 2 to 3 cans of supplement per day, and has maintained her weight.  Blood pressure slightly low, but no symptoms related to this.  She is been off diuretic, without issues.  Her energy level is reduced, and there are some things that she feels like she just cannot do, but this appears to be more due to fatigue rather than mood.  No chest pain, dyspnea, palpitations.  Her joint pain has been stable.  She has follow-up imaging coming up in the next few weeks, with reevaluation by her surgeon to see if the area has reduced enough to proceed with surgical intervention   Current Outpatient Medications  Medication Sig Dispense Refill  . acetaminophen  (TYLENOL ) 650 MG ER tablet Take 1,300 mg by mouth 2 (two) times daily as needed    . albuterol  MDI, PROVENTIL , VENTOLIN , PROAIR , HFA 90 mcg/actuation inhaler Inhale 2 inhalations into the lungs every 6 (six) hours as needed for Wheezing 1 each 1  . amLODIPine  (NORVASC ) 5 MG tablet Take 1 tablet (5 mg total) by mouth 2 (two) times daily 180 tablet 3  . apremilast  (OTEZLA ) 30 mg tablet Take 30 mg by mouth 2 (two) times daily.    SABRA aspirin 81 MG EC tablet Take 81 mg by mouth once daily.    . CALCIUM  CARBONATE-VITAMIN D3 ORAL Take 1 tablet by mouth once daily 600 mg    . carvediloL  (COREG ) 6.25 MG tablet Take 1 tablet (6.25 mg total) by mouth 2 (two) times daily with meals 60 tablet 0  . citalopram  (CELEXA ) 20 MG tablet Take 1 tablet (20 mg total) by mouth once daily 90 tablet 1  . galantamine  (RAZADYNE  ER) 16 MG ER capsule Take 1 capsule (16 mg total) by mouth  daily with breakfast 90 capsule 3  . levothyroxine  (SYNTHROID ) 137 MCG tablet TAKE ONE TABLET DAILY ON EMPTY STOMACH. WAIT 30 MINUTES BEFORE TAKING OTHER MEDS. 90 tablet 0  . LORazepam  (ATIVAN ) 0.5 MG tablet Takes 2 at night. Takes additional one tablet if she still hasn't gone to sleep 2-3 hours later 90 tablet 5  . losartan  (COZAAR ) 100 MG tablet Take 1 tablet (100 mg total) by mouth once daily 90 tablet 3  . melatonin 10 mg Tab Take 10 mg by mouth at bedtime    . memantine  (NAMENDA ) 10 MG tablet Take 1 tablet (10 mg total) by mouth 2 (two) times daily for 360 days 180 tablet 3  . OMEGA-3 FATTY ACIDS-FISH OIL ORAL Take 1 capsule by mouth once daily 1000 mg    . oxyCODONE  (ROXICODONE ) 5 MG immediate release tablet Take 1 tablet (5 mg total) by mouth every 6 (six) hours as needed for Pain 7 tablet 0  . traZODone  (DESYREL ) 50 MG tablet Take 2 tablets (100 mg total) by mouth at bedtime as needed for Sleep 180 tablet 1  . triamcinolone  (NASACORT  AQ) 55 mcg nasal spray Place 2 sprays into both nostrils once daily     No current facility-administered medications for this visit.    Allergies as of 01/12/2023 - Reviewed 01/12/2023  Allergen Reaction Noted  . Lycopene-lutein-fruit extract Hives 01/05/2015  . Buprenorphine hcl Nausea  and Vomiting 01/05/2015  . Donepezil  Other (See Comments) 11/11/2018  . Morphine  Nausea and Vomiting 07/21/2013    Past Medical History:  Diagnosis Date  . Anemia   . Arthritis   . Carpal tunnel syndrome   . Depression   . Hematuria, microscopic 07/01/2020   CT 3/22 with bilateral nonobstructing renal stones  . History of constipation 03/15/2018  . Hyperlipidemia   . Hypertension   . Hypothyroidism   . Metabolic syndrome   . Obesity   . Personal history of colonic polyps 03/15/2018  . Psoriasis   . Sleep apnea     Past Surgical History:  Procedure Laterality Date  . Left total hip arthroplasty  12/16/2003  . Right total hip arthroplasty  05/15/2006  .  Right total knee arthroplasty using computer-assisted navigation  06/14/2011   Dr Mardee  . COLONOSCOPY  01/02/2013   Adenomatous Polyp; FHCP (Sister) CBF 12/2017 Recall ltr mailed 12/11/17  . COLONOSCOPY  08/16/2018   Adenomatous Polyps; FHCP (Sister) CBF 07/2021  . Left total knee arthroplasty using computer-assisted navigation  09/20/2018   Dr Mardee  . COLONOSCOPY  04/19/2022   PHx Adenomatous Polyps/No Repeat/TKT  . ERCP N/A 09/19/2022   Procedure: ERCP and Upper EUS;  Surgeon: Vicci Renaee Fee, MD;  Location: [facility] OR;  Service: Gastroenterology;  Laterality: N/A;  . CORI KAPUR EXAMINATION N/A 09/19/2022   Procedure: UPPER EUS;  Surgeon: Vicci Renaee Fee, MD;  Location: [facility] OR;  Service: Gastroenterology;  Laterality: N/A;  . CHOLECYSTECTOMY     1980s   Social History   Tobacco Use  . Smoking status: Never  . Smokeless tobacco: Never  Vaping Use  . Vaping status: Never Used  Substance Use Topics  . Alcohol use: No    Alcohol/week: 0.0 standard drinks of alcohol  . Drug use: No   Family History  Problem Relation Name Age of Onset  . High blood pressure (Hypertension) Mother    . Arthritis Mother    . Colon polyps Sister      Goals Addressed             This Visit's Progress   . Maintain health/healthy lifestyle   On track        BP 114/68   Pulse 80   Ht 152.4 cm (5')   Wt 81.3 kg (179 lb 3.2 oz)   LMP  (LMP Unknown)   SpO2 96%   BMI 35.00 kg/m   General: Pleasant patient, in no distress HEENT: No scleral icterus.  Mild temporal wasting Neck:  trachea midline; no thyromegaly Lungs: Reduced airflow in the bases without wheeze or retractions Cardiac:  Regular rate and rhythm without murmur, gallops, or rubs.  Carotid and radial pulses 2+. Abdominal: soft, nondistended, nontender.  No guard or rebound.   Extremities:  No clubbing, cyanosis.  Postoperative changes left knee.  No significant edema.  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  Hypertension.  Blood pressure controlled, and will need to watch for hypotension, particular if she has more weight loss and postoperatively.  She will monitor at home, and contact us  for readings less than  100/60.  Following metabolic panel, urinalysis, creatinine periodically. 2.  Pancreatic cancer.  Greatly appreciate oncology and surgery care for the patient.  She is finished up neoadjuvant therapy, and has follow-up imaging coming up followed by surgical evaluation.  Liver enzymes have returned to normal with placement of biliary stent. 3.  Depression.  Her fatigue appears more driven by chemotherapy rather than mood, but we will see how this progresses as she finishes up therapy.  Continue current regimen 4.  Prediabetes/dyslipidemia   A1c is worse, may be related to steroids within her neoadjuvant therapy regimen; this point we will follow.  A1c less than 8.  Monitor met B, A1c, urinalysis, liver, lipids periodically. 5.  Psoriasis/immunosuppression.  Has been stable on her current regimen.  Follow-up with dermatology as scheduled or needed. 6.  Gout.  No events.  Uric acid well-controlled. 7.  Moderate protein calorie malnutrition.she is doing a good job with supplement; she will follow her weight at home.  Electrolytes and protein levels. 8.  Hypothyroidism.  Continue current treatment and monitor TSH. 9.  Health maintenance.  Annual exam is  arranged for 6 months, returning sooner if needed progress with treatment   BERT JACK KLEIN III, MD  Portions of this note were created using dictation software and may contain typographical errors.

## 2023-01-15 ENCOUNTER — Telehealth: Payer: Self-pay

## 2023-01-15 ENCOUNTER — Other Ambulatory Visit: Payer: Self-pay

## 2023-01-15 NOTE — Progress Notes (Signed)
Opened in error

## 2023-01-15 NOTE — Telephone Encounter (Signed)
Left message on voicemail for patient to return my call. We received a fax from [facility] requesting a new prescription be sent to them for Otezla 30 mg po BID, but patient hasn't been seen in over a year, and we must see her before refilling.

## 2023-01-16 NOTE — Telephone Encounter (Signed)
Patient is now being seen by Dr. Adolphus Birchwood at [facility]. aw

## 2023-02-27 NOTE — Discharge Summary (Signed)
 General Surgery Discharge Summary   Admit Date: 02/08/2023 Discharge Date: 03/01/2023 Admitting Physician: Carleton Garnette Ip, MD  Discharge Physician: No att. 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)   Mass of head of pancreas (HHS-HCC)   Patient Co-morbidities and/or Additional Problems Addressed During Admission: Problem List  Date Reviewed: 02/06/2023          ICD-10-CM Priority Class Noted - Resolved Diagnosed   Abnormal CT of the abdomen R93.5   02/17/2023 - Present    * (Principal) Bandemia D72.825   02/17/2023 - Present    AKI (acute kidney injury) (CMS-HCC) N17.9   02/17/2023 - Present    Pancreatic cancer (CMS/HHS-HCC) C25.9   02/08/2023 - Present    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   10/16/2022 - Present    Overview Signed 01/11/2023 10:28 PM by Fernande Ophelia Marinell DOUGLAS, MD    Diagnosed 7/24; Stage IA (cT1c, cN0, cM0)       Hypokalemia E87.6   09/17/2022 - Present    Hypomagnesemia E83.42   09/17/2022 - Present    Anxiety and depression (Chronic) F41.9, F32.A   09/17/2022 - Present    Memory impairment (Chronic) R41.3   09/17/2022 - Present    Primary insomnia (Chronic) F51.01   09/17/2022 - Present    Moderate protein-calorie malnutrition (CMS/HHS-HCC) E44.0   09/17/2022 - Present    Asymptomatic bradycardia R00.1   09/13/2022 - Present    Overview Signed 11/07/2022  7:59 AM by Estelle Palma, CMA    Last Assessment & Plan:   Formatting of this note might be different from the original.  Unchanged EKG compared to EKG in 09/09/2018.      DNR (do not resuscitate) Z66   09/13/2022 - Present    Overview Signed 11/07/2022  7:59 AM by Estelle Palma, CMA    Formatting of this note might be different from the original.   Per patient at bedside (09/13/22) with spouse and daughter in the room, she has living will and has indicated her wish for DO NOT RESUSCITATE.     Last Assessment & Plan:   Formatting of this note might be different from the original.  Confirmed with patient at bedside with her spouse and daughter in the room      Elevated liver enzymes R74.8   09/13/2022 - Present    Overview Signed 11/07/2022  7:59 AM by Estelle Palma, CMA    Last Assessment & Plan:   Formatting of this note might be different from the original.  Suspect secondary to biliary obstruction in setting of concerning pancreatic mass  GI service has been consulted  Patient pending bed availability at [facility]  Patient on waiting list, and has not lost her place for transfer  Oxycodone  5 mg p.o. every 6 hours as needed for moderate pain, 1 day ordered; morphine  4 mg IV every 4 hours as needed for severe pain, 4 doses ordered  Lactated ringer  infusion at 125 mL/h, 2 days ordered  Recheck BMP, LFTs in a.m.  Admit to telemetry medical, inpatient      Prediabetes R73.03   07/07/2021 - Present    Other emphysema (CMS/HHS-HCC) (Chronic) J43.8   07/01/2020 - Present    Immunosuppression (CMS/HHS-HCC) D84.9   07/01/2020 - Present    Hematuria, microscopic R31.29  07/01/2020 - Present    Overview Signed 07/01/2020  1:00 PM by Fernande Ophelia Marinell DOUGLAS, MD    CT 3/22 with bilateral nonobstructing renal stones      Renal stones N20.0   05/20/2020 - Present    Overview Signed 05/20/2020 12:35 PM by Fernande Ophelia Marinell III, MD    CT scan 3/22 with bilateral nonobstructing renal stones.      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   05/30/2015 - Present    Status post total right knee replacement Z96.651   05/30/2015 - Present     Recurrent major depressive disorder, in full remission (CMS-HCC) F33.42   05/21/2015 - Present    Hypothyroidism (Chronic) E03.9   12/25/2013 - Present    Hyperlipidemia E78.5   12/25/2013 - Present    Class 1 obesity due to excess calories with serious comorbidity and body mass index (BMI) of 32.0 to 32.9 in adult E66.811, E66.09, Z68.32   12/25/2013 - Present    Psoriasis (Chronic) L40.9   12/25/2013 - Present    Hypertension (Chronic) I10   07/28/2013 - Present    OSA on CPAP (Chronic) G47.33   07/28/2013 - Present    RESOLVED: Mass of head of pancreas (HHS-HCC) K86.89   09/19/2022 - 01/11/2023    RESOLVED: Biliary obstruction due to malignant neoplasm (CMS/HHS-HCC) K83.1, C80.1   09/17/2022 - 02/17/2023    RESOLVED: Pancreatic mass (HHS-HCC) K86.89   09/17/2022 - 01/11/2023    RESOLVED: Obstructive jaundice (CMS-HCC) K83.1   09/17/2022 - 01/11/2023    RESOLVED: AKI (acute kidney injury) (CMS-HCC) N17.9   09/17/2022 - 01/11/2023    RESOLVED: Elevated serum creatinine R79.89   09/13/2022 - 01/11/2023    Overview Signed 11/07/2022  7:59 AM by Estelle Palma, CMA    Last Assessment & Plan:   Formatting of this note might be different from the original.  Does not meet criteria for acute kidney injury at this time  Continue IV fluid hydration, recheck BMP in a.m.      RESOLVED: Depression F32.A   Unknown - 05/21/2015    RESOLVED: Acute bronchitis due to infection J20.9   01/22/2014 - 05/21/2015    RESOLVED: Metabolic syndrome E88.810   07/28/2013 - 05/21/2015     Consult Orders: IP CONSULT TO NUTRITION SERVICES [facility] IP CASE MANAGEMENT - CONSULT TO CARE COORDINATION SPECIALIST [facility] IP CASE MANAGEMENT - CONSULT TO CARE COORDINATION SPECIALIST IP CONSULT TO GERONTOLOGY [facility] IP CASE MANAGEMENT - CONSULT TO CARE COORDINATION SPECIALIST [facility] IP CASE MANAGEMENT - CONSULT TO CARE COORDINATION SPECIALIST IP CONSULT TO INTERVENTIONAL RADIOLOGY ([facility]) [facility] IP CASE MANAGEMENT - CONSULT TO CARE COORDINATION  SPECIALIST IP CONSULT TO INTERVENTIONAL RADIOLOGY ([facility]) [facility] IP CASE MANAGEMENT - CONSULT TO CARE COORDINATION SPECIALIST [facility] IP CASE MANAGEMENT - CONSULT TO CARE COORDINATION SPECIALIST   Surgeries Performed: Procedure(s): (Open Whipple) Pancreatectomy, proximal subtotal with total duodenectomy, partial gastrectomy, choledochoenterostomy and gastrojejunostomy direct repair of the superior mesenteric vein  Brief History of Present Illness: Victoria Flynn is a 78 y.o. female with a PMHx of  pancreatic cancer, COPD, OSA (CPAP), HTN, nephrolithiasis, psoriasis, arthritis, hypothyroidism, obesity, anemia, depression, pre-DM who presents to [facility] on 02/08/2023 for an elective Whipple procedure for a history of pancreatic adenocarcinoma.  Review of Systems A ten point review of systems was negative except that mentioned in HPI.   Allergies Allergies  Allergen Reactions  . Lycopene-Lutein-Fruit Extract Hives    Peaches, pineapples, apples.  . Buprenorphine Hcl Nausea and Vomiting  . Donepezil  Other (See Comments)    Nightmares, even when taken in the morning at lowest dose   . Morphine  Nausea and Vomiting    Home Medications Prior to Admission medications   Medication Sig Taking? Last Dose  amLODIPine  (NORVASC ) 5 MG tablet Take 1 tablet (5 mg total) by mouth 2 (two) times daily Yes 02/08/2023 at  3:30 AM  carvediloL  (COREG ) 6.25 MG tablet Take 1 tablet (6.25 mg total) by mouth 2 (two) times daily with meals Yes 02/08/2023 Morning  citalopram  (CELEXA ) 20 MG tablet Take 1 tablet (20 mg total) by mouth once daily Patient taking differently: Take 20 mg by mouth every morning Yes 02/08/2023 at 12:00 AM  enoxaparin  (LOVENOX ) 40 mg/0.4 mL injection syringe Inject 0.4 mLs (40 mg total) subcutaneously once daily for 8 days in abdomen, rotating site with each injection. Yes   levothyroxine  (SYNTHROID ) 137 MCG tablet TAKE ONE TABLET DAILY ON EMPTY STOMACH. WAIT 30 MINUTES BEFORE TAKING OTHER  MEDS. Patient taking differently: Take 137 mcg by mouth every morning before breakfast (0630) Yes 02/08/2023 at  3:30 AM  LORazepam  (ATIVAN ) 0.5 MG tablet Takes 2 at night. Takes additional one tablet if she still hasn't gone to sleep 2-3 hours later Yes 02/07/2023 at  9:00 PM  melatonin 10 mg Tab Take 10 mg by mouth at bedtime Yes 02/07/2023 at  9:00 PM  pantoprazole  (PROTONIX ) 40 MG DR tablet Take 1 tablet (40 mg total) by mouth once daily for 90 days Yes   sulfamethoxazole-trimethoprim (BACTRIM DS) 800-160 mg tablet Take 1 tablet (160 mg of trimethoprim total) by mouth every 12 (twelve) hours for 6 days Yes   UNABLE TO FIND Pt uses a c-pap Yes 02/07/2023  acetaminophen  (TYLENOL ) 650 MG ER tablet Take 1,300 mg by mouth 2 (two) times daily as needed  More than a month  albuterol  MDI, PROVENTIL , VENTOLIN , PROAIR , HFA 90 mcg/actuation inhaler Inhale 2 inhalations into the lungs every 6 (six) hours as needed for Wheezing  More than a month  apremilast  (OTEZLA ) 30 mg tablet Take 30 mg by mouth 2 (two) times daily.  02/06/2023  aspirin 81 MG EC tablet Take 81 mg by mouth every morning  02/01/2023  CALCIUM  CARBONATE-VITAMIN D3 ORAL Take 1 tablet by mouth once daily 600 mg  02/05/2023  galantamine  (RAZADYNE  ER) 16 MG ER capsule Take 1 capsule (16 mg total) by mouth daily with breakfast  Unknown  hydrocortisone  2.5 % cream Apply topically as needed  Unknown  ibuprofen (MOTRIN) 200 MG tablet Take 200 mg by mouth as needed for Pain  Unknown  ketoconazole  (NIZORAL ) 2 % cream Apply topically as needed  Unknown  losartan  (COZAAR ) 100 MG tablet Take 1 tablet (100 mg total) by mouth once daily Patient taking differently: Take 100 mg by mouth every morning  Unknown  memantine  (NAMENDA ) 10 MG tablet Take 1 tablet (10 mg total) by mouth 2 (two) times daily for 360 days  Unknown  OMEGA-3 FATTY ACIDS-FISH OIL ORAL Take 1 capsule by mouth once daily 1000 mg  02/04/2023  traZODone  (DESYREL ) 50 MG tablet Take 2  tablets (100 mg total) by mouth at bedtime as needed for Sleep  Unknown  triamcinolone  (NASACORT  AQ) 55 mcg nasal spray Place 2 sprays into  both nostrils once daily  Unknown    Past Medical History Past Medical History:  Diagnosis Date  . Anemia   . Anesthesia complication    Pt had to be given narcan after biliary stent placement. Sensitive to opiate medications  . Arthritis   . Biliary obstruction due to malignant neoplasm (CMS/HHS-HCC) 09/17/2022  . Carpal tunnel syndrome   . COPD (chronic obstructive pulmonary disease) (CMS/HHS-HCC)   . Depression   . GERD (gastroesophageal reflux disease)   . Hematuria, microscopic 07/01/2020   CT 3/22 with bilateral nonobstructing renal stones  . History of blood transfusion    with hip replacement  . History of cancer    pancreatic cancer; also skin cancer-followed by Derm  . History of constipation 03/15/2018  . Hyperlipidemia   . Hypertension   . Hypothyroidism   . Metabolic syndrome   . MRSA (methicillin resistant Staphylococcus aureus)   . Obesity   . Personal history of colonic polyps 03/15/2018  . Psoriasis   . Sleep apnea     Past Surgical History Past Surgical History:  Procedure Laterality Date  . Left total hip arthroplasty  12/16/2003  . Right total hip arthroplasty  05/15/2006  . Right total knee arthroplasty using computer-assisted navigation  06/14/2011   Dr Mardee  . COLONOSCOPY  01/02/2013   Adenomatous Polyp; FHCP (Sister) CBF 12/2017 Recall ltr mailed 12/11/17  . COLONOSCOPY  08/16/2018   Adenomatous Polyps; FHCP (Sister) CBF 07/2021  . Left total knee arthroplasty using computer-assisted navigation  09/20/2018   Dr Mardee  . COLONOSCOPY  04/19/2022   PHx Adenomatous Polyps/No Repeat/TKT  . ERCP N/A 09/19/2022   Procedure: ERCP and Upper EUS;  Surgeon: Vicci Renaee Fee, MD;  Location: [facility] OR;  Service: Gastroenterology;  Laterality: N/A;  . CORI KAPUR EXAMINATION N/A  09/19/2022   Procedure: UPPER EUS;  Surgeon: Vicci Renaee Fee, MD;  Location: [facility] OR;  Service: Gastroenterology;  Laterality: N/A;  . PANCREATICODUODENECTOMY W/PANCREATICOJEJUNOSTOMY WHIPPLE N/A 02/08/2023   Procedure: (Open Whipple) Pancreatectomy, proximal subtotal with total duodenectomy, partial gastrectomy, choledochoenterostomy and gastrojejunostomy;  Surgeon: Elza Carleton Senior, MD;  Location: [facility] OR;  Service: General Surgery;  Laterality: N/A;  . REPAIR BLOOD VESSEL INTRA-ABDOMINAL  02/08/2023   Procedure: direct repair of the superior mesenteric vein;  Surgeon: Elza Carleton Senior, MD;  Location: [facility] OR;  Service: General Surgery;;  . CHOLECYSTECTOMY     1980s    Family History Family History  Problem Relation Name Age of Onset  . High blood pressure (Hypertension) Mother    . Arthritis Mother    . Colon polyps Sister    . Anesthesia problems Neg Hx    . Malignant hypertension Neg Hx    . Malignant hyperthermia Neg Hx    . Pseudochol deficiency Neg Hx    . PONV Neg Hx      Social History Social History   Socioeconomic History  . Marital status: Married    Spouse name: Lamar  . Number of children: 2  . Years of education: 72  . Highest education level: High school graduate  Occupational History  . Occupation: Retired  Tobacco Use  . Smoking status: Never  . Smokeless tobacco: Never  Vaping Use  . Vaping status: Never Used  Substance and Sexual Activity  . Alcohol use: No    Alcohol/week: 0.0 standard drinks of alcohol  . Drug use: Never  . Sexual activity: Defer    Partners: Male   Social Drivers of Health  Financial Resource Strain: Low Risk  (02/22/2023)   Overall Financial Resource Strain (CARDIA)   . Difficulty of Paying Living Expenses: Not hard at all  Food Insecurity: No Food Insecurity (02/22/2023)   Hunger Vital Sign   . Worried About Programme researcher, broadcasting/film/video in the Last Year: Never true   . Ran Out of Food in the Last Year: Never true   Transportation Needs: No Transportation Needs (02/22/2023)   PRAPARE - Transportation   . Lack of Transportation (Medical): No   . Lack of Transportation (Non-Medical): No  Housing Stability: Low Risk  (02/22/2023)   Housing Stability Vital Sign   . Unable to Pay for Housing in the Last Year: No   . Number of Times Moved in the Last Year: 0   . Homeless in the Last Year: No    Pertinent Imaging: Pertinent imaging reviewed Please refer to the imaging obtained during this admission period  Discharge Exam:  General: Alert, cooperative, in NAD Neuro: No gross focal motor or sensory deficits  Cardiovascular: NRRR, hemodynamically stable Respiratory: Non-labored breathing on RA Abdomen: Soft, non-distended, appropriately TTP Incision: c/d/i DRAINS/FOLEY: Upper abdominal drain with similar yellow/milky output. RLQ JP similar yellow/milky output. R flank drain with similar yellow/milky output. All of their respective outputs are clearer than the prior day  BP 130/79   Pulse 72   Temp 36.7 C (98.1 F) (Oral)   Resp 17   Ht 154.9 cm (5' 1)   Wt 74 kg (163 lb 2.3 oz)   LMP  (LMP Unknown)   SpO2 99%   BMI 30.83 kg/m    Hospital Course: Therisa NOVAK. [place], a 78 year old female with a medical history of pancreatic cancer, COPD, OSA managed with CPAP, hypertension, nephrolithiasis, psoriasis, arthritis, hypothyroidism, obesity, anemia, depression, and pre-diabetes, was admitted to [facility] on 02/08/2023 for an elective Whipple procedure due to pancreatic adenocarcinoma. The procedure, conducted by Dr. Elza, included a proximal subtotal pancreatectomy with total duodenectomy, partial gastrectomy, choledochoenterostomy, gastrojejunostomy, and direct repair of the superior mesenteric vein. Postoperatively, she was transferred to the PACU in stable condition and later moved to the surgical step-down unit.  On 02/09/2023, Aleen experienced worsening pain in the morning, which was managed  with two epidural boluses, and had low bilious output from the nasogastric tube, leading to its removal. She was encouraged to use incentive spirometry, wean off oxygen, and begin ambulating. By 02/10/2023, she was progressing well post-surgery, continuing with the epidural and Foley catheter, advancing to clear liquids, and ambulating. On 02/11/2023, her diet was advanced to full liquids. By 02/12/2023, her pain was adequately controlled, allowing for discontinuation of the epidural, and her diet was further advanced from full liquids to regular as tolerated. She was encouraged to be out of bed frequently.  On 02/13/2023, Nikaela advanced to a post-surgical bland diet but reported visual and auditory hallucinations overnight. A geriatrics consultation was obtained, and their recommendations were appreciated. She continued to make steady progress in oral intake and mobility. On 02/14/2023, she tolerated small amounts of a regular diet, with the addition of a magic cup to meals, and was encouraged to ambulate frequently. By 02/15/2023, her diet was well tolerated, but an elevated amylase level in the left drain prompted its retention.  On 02/16/2023, an increase in white blood cell count and creatinine led to additional IV fluids and a CT scan, which revealed an anterior fluid collection. The right drain appeared slightly purulent, and IV Zosyn  was initiated. On 02/18/2023, persistent  leukocytosis prompted ultrasound-guided aspiration of the fluid collection, suspected to be a pancreatic leak. Cultures grew Klebsiella and Enterococcus faecalis, with Klebsiella sensitive to Zosyn . Interventional Radiology was consulted for drain placement.  On 02/19/2023, a percutaneous pigtail drain was placed, and the patient's diet was switched to low fat, low cholesterol. Following this, Uyen showed improvement, allowing for the removal of the left JP drain on 02/20/2023, while the right JP drain remained due to purulent  output. On 02/21/2023, she maintained a low fat, low cholesterol diet, with the right JP drain still in place.  On 02/22/2023, a CT scan showed an enlarged fluid collection in the mesentery, leading to another IR-guided drain placement on 02/23/2023. The patient remained on Zosyn , initiated on 02/16/2023. By 02/24/2023, discharge planning was underway with recommendations for home health services. Brigett sought assistance from a family friend to help manage the right flank drain. On 02/25/2023, triglyceride levels of the right flank drain were ordered and found to be elevated at 4852 .  Throughout early January, Makynli continued to feel well, adhering to a low-fat diet and ambulating regularly. On 02/26/2023, the right posterior drain remained chylous, and the importance of a very low-fat diet was emphasized. Drain output and WBC trends were monitored. By 02/27/2023, drain output and WBC were trending downwards, leading to the cessation of IV Zosyn  and initiation of a 7-day course of oral Bactrim. Discharge occurred on 03/01/23 given that he WBC continued to down trend, she ws clinically doing well, and she had proper resources in place for drain care. She was discharged with lovenox  DVT prophylaxis through 03/09/23 which nursing taught her how to do while inpatient. Her staples were removed at bedside on 03/01/23  Discharge Disposition:  Home with home health: PT/OT  Patient Instructions:  Current Discharge Medication List     START taking these medications   Details  enoxaparin  (LOVENOX ) 40 mg/0.4 mL injection syringe Inject 0.4 mLs (40 mg total) subcutaneously once daily for 8 days in abdomen, rotating site with each injection. Qty: 8 each, Refills: 0    pantoprazole  (PROTONIX ) 40 MG DR tablet Take 1 tablet (40 mg total) by mouth once daily for 90 days Qty: 30 tablet, Refills: 2    sulfamethoxazole-trimethoprim (BACTRIM DS) 800-160 mg tablet Take 1 tablet (160 mg of trimethoprim total) by mouth every 12 (twelve)  hours for 6 days Qty: 12 tablet, Refills: 0       CONTINUE these medications which have NOT CHANGED   Details  amLODIPine  (NORVASC ) 5 MG tablet Take 1 tablet (5 mg total) by mouth 2 (two) times daily Qty: 180 tablet, Refills: 3    carvediloL  (COREG ) 6.25 MG tablet Take 1 tablet (6.25 mg total) by mouth 2 (two) times daily with meals Qty: 60 tablet, Refills: 0    citalopram  (CELEXA ) 20 MG tablet Take 1 tablet (20 mg total) by mouth once daily Qty: 90 tablet, Refills: 1    levothyroxine  (SYNTHROID ) 137 MCG tablet TAKE ONE TABLET DAILY ON EMPTY STOMACH. WAIT 30 MINUTES BEFORE TAKING OTHER MEDS. Qty: 90 tablet, Refills: 0    LORazepam  (ATIVAN ) 0.5 MG tablet Takes 2 at night. Takes additional one tablet if she still hasn't gone to sleep 2-3 hours later Qty: 90 tablet, Refills: 0    melatonin 10 mg Tab Take 10 mg by mouth at bedtime    UNABLE TO FIND Pt uses a c-pap    acetaminophen  (TYLENOL ) 650 MG ER tablet Take 1,300 mg by mouth 2 (two) times daily  as needed    albuterol  MDI, PROVENTIL , VENTOLIN , PROAIR , HFA 90 mcg/actuation inhaler Inhale 2 inhalations into the lungs every 6 (six) hours as needed for Wheezing Qty: 1 each, Refills: 1    apremilast  (OTEZLA ) 30 mg tablet Take 30 mg by mouth 2 (two) times daily.    aspirin 81 MG EC tablet Take 81 mg by mouth every morning    CALCIUM  CARBONATE-VITAMIN D3 ORAL Take 1 tablet by mouth once daily 600 mg    galantamine  (RAZADYNE  ER) 16 MG ER capsule Take 1 capsule (16 mg total) by mouth daily with breakfast Qty: 90 capsule, Refills: 3    hydrocortisone  2.5 % cream Apply topically as needed    ibuprofen (MOTRIN) 200 MG tablet Take 200 mg by mouth as needed for Pain    ketoconazole  (NIZORAL ) 2 % cream Apply topically as needed    losartan  (COZAAR ) 100 MG tablet Take 1 tablet (100 mg total) by mouth once daily Qty: 90 tablet, Refills: 3    memantine  (NAMENDA ) 10 MG tablet Take 1 tablet (10 mg total) by mouth 2 (two) times daily for  360 days Qty: 180 tablet, Refills: 3    OMEGA-3 FATTY ACIDS-FISH OIL ORAL Take 1 capsule by mouth once daily 1000 mg    traZODone  (DESYREL ) 50 MG tablet Take 2 tablets (100 mg total) by mouth at bedtime as needed for Sleep Qty: 180 tablet, Refills: 1    triamcinolone  (NASACORT  AQ) 55 mcg nasal spray Place 2 sprays into both nostrils once daily        Activity:  no heavy lifting for 6 weeks  Diet: Low-fat low-cholesterol diet   Results Pending at Discharge:  Surgical pathology DIAGNOSIS  A. SMV margin, biopsy:   Involved by carcinoma.  See comment.     B. Lymph nodes, hepatic artery, excision:   One lymph node, positive for metastatic carcinoma, 13 mm (1/1).     C. Pancreas, Whipple, resection:   Pancreatic adenocarcinoma, moderately differentiated, 5.0 cm. Adenocarcinoma involves duodenal wall and peripancreatic adipose tissue. Reactive/fibrotic areas, consistent with treatment effect. Pancreatic intraepithelial lesion (PanIN), low grade, present at pancreatic head margin. Five of thirteen lymph nodes, positive for metastatic adenocarcinoma (5/13).  Specimen margins uninvolved by invasive carcinoma (closest margin: 0.7, uncinate).  See synoptic report.     D. Jejunum, gross only, excision:   Benign jejunum. One lymph node, negative for malignancy (0/1).     E. Lymph nodes, portal lymph node, excision:   One lymph node, negative for malignancy (0/1).    Follow-up Tests Recommended:  N/A  Cortland Meissner, MD [facility], PGY-1 Functional Pager [phone]  Attestation Statement:   I personally saw and evaluated the patient, and participated in the management and treatment plan as documented in the resident/fellow note. Tolerating low fat diet and comfortable with management of drains.  Will DC home and f/u in clinic next week. CARLETON GARNETTE IP, MD  PANCREAS POST OP DATA:  Attending Confirmed?: Yes   Post op grade B/C pancreatic  leak/fistula (POPF): Yes   What is the Grade#?:  B Date?:  02/18/2023 IR/endoscopic drain placed or surgical drain manipulated?: Yes   Date?:  02/19/2023 Reoperation?: No   Surgical site infection? (SSI): No   NG replaced?: No   Date first tolerated solid food:  02/12/2023 Delayed Gastric Emptying (DGE)?: No   Post op ileus?: No   Post surgical Hemorrhage?: No   Intraoperative transfusion?: No   Post op transfusion?: No   Post op  date Foley removed:  02/12/2023 Post op pneumonia?: No   Post op pulmonary embolism?: No   Post op deep vein thrombosis?: No

## 2023-05-04 NOTE — Progress Notes (Signed)
 [facility] RETURN VISIT     PATIENT IDENTIFICATION  Victoria Flynn is a 78 y.o. year old female with a PMH significant for HTN, HLD, hypothyroidism, OSA, depression, prediabetes, emphysema, psoriasis, arthritis and pancreatic adenocarcinoma c/b malignant biliary obstruction s/p ERCP and CBD stent placement s/p 34m neoadjuvant Gem/Abraxane followed by Whipple resection on 02/08/23 now back on adjuvant gem/abraxane.    CANCER STAGING   Hematology/Oncology History  Adenocarcinoma of pancreatic duct (CMS-HCC)  09/19/2022 -  Cancer Staged   Staging form: Exocrine Pancreas, AJCC 8th Edition - Clinical stage from 09/19/2022: Stage IA (cT1c, cN0, cM0) - Signed by Jia, Jingquan, MD on 10/16/2022    10/16/2022 Initial Diagnosis   Pancreatic adenocarcinoma (CMS-HCC)       ONCOLOGIC HISTORY   Pancreatic adenocarcinoma, resectable 09/12/22, presented to PCP with epigastric pain/fulness and new onset jaundice. Labs notable for AST 213, ALT 282, Alk Phos 373, Tbili 11.3. CT AP showed s/p cholecystectomy. Marked intra and extrahepatic biliary dilatation with interval pancreatic ductal dilatation, concerning for biliary obstruction. Possible ill-defined hypodense  mass at the uncinate process of pancreas though there is ill-defined soft tissue density as well in the anticipated region of the ampulla. B/l nonobstructing kidney stones. Large uterine fibroid. 14 mm L adrenal nodule containing fat c/w small myelolipoma.  09/18/22, MRCP showed marked diffuse intra and extra biliary ductal dilatation measuring up to 2 cm and the CBD with abrupt narrowing at the pancreatic head. Hypoenhancing, diffusion restriction uncinate process mass  measuring 1.1 x 1.1 cm with upstream dilatation of the main pancreatic duct. The SMA, celiac axis and common hepatic arteries are not involved. Less than 180 degrees abutment of the SMV. No metastatic disease noted.  CT CAP dual panc protocol showed numerous b/l pulmonary  micronodules. 09/19/22, EUS showed a mass in the uncinate process of the pancreas. Biopsy of the mass showed adenocarcinoma. ERCP showed a localized abnormal mucosal changes in the 2nd portion of the duodenum lateral and proximal to the ampulla c/f tumor invasion. A single severe biliary stricture was found in the lower third of the main bile duct with marked upstream dilation. The stricture was malignant appearing. Biliary sphincterotomy peformed. Fully covered metal stent placed in CBD.  Course c/b post ERCP pancreatitis.  09/22/22, Tbili 4.4 mg/dL, AST 49, ALT 893, Alk Phos 241 10/17/22, initial consultation with [facility] 10/27/22, Initiated on Gem/Abraxane with abraxane dose reduced to 100 mg/m2; CA 19-9=306.1 01/05/23, C3D15 Gem/Abraxane. CA 19-9=43.27 02/08/23, Whipple surgery. Post op course c/b intra-abdominal infection/abscess requiring pigtail train on IR drain placement and antibiotics. Final path showed a 5 cm moderately differentiated ductal adenocarcinoma in the pancreatic head extending to duodenal wall and peripancreatic soft tissue, partial treatment response, +LVI, +PNI, invasive carcinoma present at SMV margin, low grade PanIN present at pancreatic margin, 6/16 LNs involved; ypT3N2  02/22/23, CT AP w/ contrast showed improvement in L hepatic abscess without apparent evidence of recurrence/metastasis.  04/06/23, Gem/Abraxane resumed. CA 19-9=431.4   MOLECULAR DIAGNOSTICS   N/A  ASSESSMENT AND PLAN    # Pancreatic adenocarcinoma, resectable. S/p 74m neoadjuvant Gem/Abraxane followed by Whipple resection on 02/08/23 now back on adjuvant gem/abraxane.   Ms. Fiallos continues to tolerate Gem/Abraxane well. Labs are adequate to treat.  PLAN - Proceed with C5D1 gem/abraxane today - RTC in 2 weeks for labs, follow up and infusion - Plan 3 months of gem/abraxane post op - Repeat CT upon completion of gem/abraxane - Consider adjuvant chemoradiation if no evidence of  recurrence post  chemo  # Poor appetite: unable to tolerate dronabinol. Mirtazapine is not helpful.  - Discontinue mirtazapine  # Elevated liver enzymes: resolved.  - Continue to monitor   # Lung nodules: stable from 2019.  - Continue to monitor   # Port-a-cath in place: - EMLA cream   #Psoriasis Previously on Enbrel and currently on Otezla  (apremilast ) due to several infections while on immuno modulators. Otezla  is PDE4 inhibitor and selective immunosuppressant. - Continue Otezla  per dermatology  Orders Placed This Encounter  Procedures  . CT Chest Wo Contrast  . CT Abdomen Pelvis W Contrast  . CBC w/ Differential  . Comprehensive Metabolic Panel  . Cancer Antigen 19-9    The natural history of cancer was reviewed and discussed with the patient and family members. Treatment options as well as risks and benefits were discussed with the patient/family. Patient and family member's questions answered to their satisfaction  I spent a total of 40 minutes in both face-to-face and non-face-to-face activities for this visit on the date of this encounter. Medical decision making based high complexity of cancer and treatment risk of complications and monitoring toxicity of chemotherapy.      INTERVAL HISTORY  Victoria Flynn is here for follow up.  History of Present Illness She reports no new problems since her last visit. Her weight has remained stable, and she continues to eat and drink regularly.  No significant nausea or fatigue. Her appetite remains unchanged, and the medication prescribed to help with her appetite has not been effective. She experienced cramps after chemotherapy in the past, but did not have them after her most recent session. Previously, the cramps were more pronounced.     REVIEW OF SYSTEMS  The remainder of a comprehensive 10 systems review is negative.   Allergies and Medications reviewed as per chart  PHYSICAL EXAMS   Vitals:   05/04/23 1211  BP: 186/84  Pulse: 97   SpO2: 96%            05/04/23 73.1 kg (161 lb 3.2 oz)  04/20/23 72.7 kg (160 lb 3.2 oz)  04/06/23 73.5 kg (162 lb)    ECOG: (2) Ambulatory and capable of self care, unable to carry out work activity, up and about > 50% or waking hours  GENERAL: well developed, well nourished, in no distress PSYCH: full and appropriate range of affect with good insight and judgement HEENT: NCAT, pupils equal, sclerae anicteric  EXT: warm and well perfused, no edema SKIN: No rashes    OBJECTIVE DATA  LABS:  Results for orders placed or performed in visit on 05/04/23  Comprehensive Metabolic Panel  Result Value Ref Range   Sodium 142 135 - 145 mmol/L   Potassium 3.9 3.5 - 5.1 mmol/L   Chloride 102 98 - 107 mmol/L   CO2 28.0 20.0 - 31.0 mmol/L   Anion Gap 12 5 - 14 mmol/L   BUN 22 9 - 23 mg/dL   Creatinine 9.27 9.44 - 1.02 mg/dL   BUN/Creatinine Ratio 31    eGFR CKD-EPI (2021) Female 85 >=60 mL/min/1.32m2   Glucose 123 70 - 179 mg/dL   Calcium  9.1 8.7 - 10.4 mg/dL   Albumin 3.6 3.4 - 5.0 g/dL   Total Protein 6.5 5.7 - 8.2 g/dL   Total Bilirubin 0.2 (L) 0.3 - 1.2 mg/dL   AST 20 <=65 U/L   ALT 19 10 - 49 U/L   Alkaline Phosphatase 88 46 - 116 U/L  Magnesium  Level  Result Value Ref Range   Magnesium  1.9 1.6 - 2.6 mg/dL  Cancer Antigen 19-9  Result Value Ref Range   CA 19-9 575.94 (H) 0 - 35 U/mL  CBC w/ Differential  Result Value Ref Range   WBC 8.5 3.6 - 11.2 10*9/L   RBC 3.66 (L) 3.95 - 5.13 10*12/L   HGB 10.6 (L) 11.3 - 14.9 g/dL   HCT 67.3 (L) 65.9 - 55.9 %   MCV 89.2 77.6 - 95.7 fL   MCH 29.1 25.9 - 32.4 pg   MCHC 32.6 32.0 - 36.0 g/dL   RDW 84.0 (H) 87.7 - 84.7 %   MPV 8.4 6.8 - 10.7 fL   Platelet 224 150 - 450 10*9/L   Neutrophils % 73.5 %   Lymphocytes % 17.9 %   Monocytes % 5.4 %   Eosinophils % 2.7 %   Basophils % 0.5 %   Absolute Neutrophils 6.2 1.8 - 7.8 10*9/L   Absolute Lymphocytes 1.5 1.1 - 3.6 10*9/L   Absolute Monocytes 0.5 0.3 - 0.8 10*9/L   Absolute  Eosinophils 0.2 0.0 - 0.5 10*9/L   Absolute Basophils 0.0 0.0 - 0.1 10*9/L   RADIOLOGY RESULTS  None today.    CARE TEAM   [facility]: Dr. Carleton Ip  FOLLOW UP   Future Appointments  Date Time Provider Department Center  05/04/2023  1:15 PM ONCINF CHAIR 34 [id number]  05/17/2023 10:00 AM HB LAB [facility] [address] [facility]  05/18/2023  9:00 AM Mitchel France, MD [facility]  05/18/2023  9:45 AM ONCINF CHAIR 32 [id number]  06/01/2023  9:30 AM ADULT ONC LAB [facility]  06/01/2023 10:30 AM Judge Russell HERO, CPP [facility]  06/01/2023 12:30 PM ONCINF CHAIR 46 [id number]  06/15/2023 10:30 AM ADULT ONC LAB [facility]  06/15/2023 11:30 AM Mitchel France, MD [facility]  06/15/2023 12:30 PM ONCINF CHAIR 30 [id number]  06/27/2023 10:30 AM HBR CT RM 2 [facility]  06/27/2023 11:30 AM HB LAB [facility] [address] [facility]  06/29/2023  9:30 AM Mitchel France, MD [facility]

## 2023-05-25 ENCOUNTER — Other Ambulatory Visit: Payer: Self-pay | Admitting: Internal Medicine

## 2023-05-25 DIAGNOSIS — Z1231 Encounter for screening mammogram for malignant neoplasm of breast: Secondary | ICD-10-CM

## 2023-06-06 ENCOUNTER — Ambulatory Visit
Admission: RE | Admit: 2023-06-06 | Discharge: 2023-06-06 | Disposition: A | Discharge status: Home / Self Care | Source: Ambulatory Visit | Attending: Internal Medicine | Admitting: Internal Medicine

## 2023-06-06 DIAGNOSIS — Z1231 Encounter for screening mammogram for malignant neoplasm of breast: Secondary | ICD-10-CM | POA: Diagnosis present

## 2023-06-29 NOTE — Progress Notes (Signed)
 [facility] RETURN VISIT     PATIENT IDENTIFICATION  Victoria Flynn is a 78 y.o. year old female with a PMH significant for HTN, HLD, hypothyroidism, OSA, depression, prediabetes, emphysema, psoriasis, arthritis and pancreatic adenocarcinoma c/b malignant biliary obstruction s/p ERCP and CBD stent placement s/p 109m neoadjuvant Gem/Abraxane followed by Whipple resection on 02/08/23 followed by 4m of adjuvant gem/abraxane.    CANCER STAGING   Hematology/Oncology History  Adenocarcinoma of pancreatic duct    09/19/2022 -  Cancer Staged   Staging form: Exocrine Pancreas, AJCC 8th Edition - Clinical stage from 09/19/2022: Stage IA (cT1c, cN0, cM0) - Signed by Jia, Jingquan, MD on 10/16/2022    10/16/2022 Initial Diagnosis   Pancreatic adenocarcinoma (CMS-HCC)       ONCOLOGIC HISTORY   Pancreatic adenocarcinoma, resectable 09/12/22, presented to PCP with epigastric pain/fulness and new onset jaundice. Labs notable for AST 213, ALT 282, Alk Phos 373, Tbili 11.3. CT AP showed s/p cholecystectomy. Marked intra and extrahepatic biliary dilatation with interval pancreatic ductal dilatation, concerning for biliary obstruction. Possible ill-defined hypodense  mass at the uncinate process of pancreas though there is ill-defined soft tissue density as well in the anticipated region of the ampulla. B/l nonobstructing kidney stones. Large uterine fibroid. 14 mm L adrenal nodule containing fat c/w small myelolipoma.  09/18/22, MRCP showed marked diffuse intra and extra biliary ductal dilatation measuring up to 2 cm and the CBD with abrupt narrowing at the pancreatic head. Hypoenhancing, diffusion restriction uncinate process mass  measuring 1.1 x 1.1 cm with upstream dilatation of the main pancreatic duct. The SMA, celiac axis and common hepatic arteries are not involved. Less than 180 degrees abutment of the SMV. No metastatic disease noted.  CT CAP dual panc protocol showed numerous b/l pulmonary  micronodules. 09/19/22, EUS showed a mass in the uncinate process of the pancreas. Biopsy of the mass showed adenocarcinoma. ERCP showed a localized abnormal mucosal changes in the 2nd portion of the duodenum lateral and proximal to the ampulla c/f tumor invasion. A single severe biliary stricture was found in the lower third of the main bile duct with marked upstream dilation. The stricture was malignant appearing. Biliary sphincterotomy peformed. Fully covered metal stent placed in CBD.  Course c/b post ERCP pancreatitis.  09/22/22, Tbili 4.4 mg/dL, AST 49, ALT 893, Alk Phos 241 10/17/22, initial consultation with [facility] 10/27/22, Initiated on Gem/Abraxane with abraxane dose reduced to 100 mg/m2; CA 19-9=306.1 01/05/23, C3D15 Gem/Abraxane. CA 19-9=43.27 02/08/23, Whipple surgery. Post op course c/b intra-abdominal infection/abscess requiring pigtail train on IR drain placement and antibiotics. Final path showed a 5 cm moderately differentiated ductal adenocarcinoma in the pancreatic head extending to duodenal wall and peripancreatic soft tissue, partial treatment response, +LVI, +PNI, invasive carcinoma present at SMV margin, low grade PanIN present at pancreatic margin, 6/16 LNs involved; ypT3N2  02/22/23, CT AP w/ contrast showed improvement in L hepatic abscess without apparent evidence of recurrence/metastasis.  04/06/23, Gem/Abraxane resumed. CA 19-9=431.4 06/15/23, completed 39m of adjuvant gem/abraxane 06/27/23, CT CAP showed multiple ill-defined hypoenhancing bilobar hepatic lesions c/f metastatic disease. S/p Whipple with enlarging soft tissue density with vascular encasement as well as new and enlarging fluid collections at the surgical site c/f residual/recurrent malignancy. CA 19-9=1486.71   MOLECULAR DIAGNOSTICS   N/A  ASSESSMENT AND PLAN    # Pancreatic adenocarcinoma, resectable. S/p 88m neoadjuvant Gem/Abraxane followed by Whipple resection on 02/08/23 followed by 61m of adjuvant  gem/abraxane.   [name] is doing  well. Unfortunately her CT showed recurrence with liver metastasis. CA 19-9 is up to ~ 1500. We discussed initiating 5FU/LV+ liposomal irinotecan as next line of therapy. Explained that her cancer is not curable and the intent of chemotherapy is palliative, the goal of which is to control tumor growth, alleviate cancer-related symptoms, improve quality of life and prolong life.     I outlined in detail the rationale, schedule as well as side effect profile of chemotherapy. This information was also written out for the patient to review.   After this discussion,  she understands the common effect from chemotherapy and that uncommon but severe effects may also result, and these can be life threatening. she has provided her verbal consent for treatment.    We also discussed the rationale for DPYD gene testing prior to start of fluoropyrimidine-based chemotherapy to minimize the risks of severe chemotherapy toxicity due to decreased DPD enzyme activity.    PLAN - Blood draw in clinic today for DPYD testing.   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  - Continue to monitor  - Oxaliplatin exposure likely contraindicated.   # Lung nodules: stable from 2019.  - Continue to monitor   # Port-a-cath in place: - EMLA cream   #Psoriasis Previously on Enbrel and currently on Otezla  (apremilast ) due to several infections while on immuno modulators. Otezla  is PDE4 inhibitor and selective immunosuppressant. - Continue Otezla  per dermatology  Orders Placed This Encounter  Procedures  . CBC and differential  . Comprehensive Metabolic Panel  . Magnesium  Level  . CBC and differential  . Comprehensive Metabolic Panel  . Magnesium  Level  . CBC and differential  . Comprehensive Metabolic  Panel  . Magnesium  Level  . CBC and differential  . Comprehensive Metabolic Panel  . Magnesium  Level  . CBC and differential  . Comprehensive Metabolic Panel  . Magnesium  Level  . CBC and differential  . Comprehensive Metabolic Panel  . Magnesium  Level  . CBC and differential  . Comprehensive Metabolic Panel  . Magnesium  Level  . Dihydropyrimidine Dehydrogenase Genotype, varies    The natural history of cancer was reviewed and discussed with the patient and family members. Treatment options as well as risks and benefits were discussed with the patient/family. Patient and family member's questions answered to their satisfaction  I spent a total of 60 minutes in both face-to-face and non-face-to-face activities for this visit on the date of this encounter. Medical decision making based high complexity of cancer and treatment risk of complications and monitoring toxicity of chemotherapy.      INTERVAL HISTORY  VANETTE NOGUCHI is here for follow up.  History of Present Illness ZACHARY LOVINS is an 78 year old female who presents for follow-up and evaluation of recent lab and imaging results. She is accompanied by Octaviano, who is legally blind.  Her abdominal cramps have resolved, and she currently does not experience any discomfort on the right side. She has occasional discomfort on the left side but is not significantly aware of it. No nausea, constipation, diarrhea, or right-sided discomfort. Occasional left-sided discomfort is noted.  Her energy levels have slightly improved, allowing her to engage in activities such as making the bed and cleaning the car. She acknowledges that she might have overexerted herself during these activities. Her appetite is fair, and  she maintains a routine of eating three meals a day. She believes her weight has remained stable over the past few weeks, maintaining around 157 pounds.    REVIEW OF SYSTEMS  The remainder of a comprehensive 10 systems review is  negative.   Allergies and Medications reviewed as per chart  PHYSICAL EXAMS   Vitals:   06/29/23 0939  BP: 158/76  Pulse: 59  Resp: 16  Temp: 36.8 C (98.2 F)  SpO2: 94%            06/29/23 71.4 kg (157 lb 8 oz)  06/15/23 71.2 kg (157 lb)  06/15/23 71.6 kg (157 lb 14.4 oz)    ECOG: (2) Ambulatory and capable of self care, unable to carry out work activity, up and about > 50% or waking hours  GENERAL: well developed, well nourished, in no distress PSYCH: full and appropriate range of affect with good insight and judgement HEENT: NCAT, pupils equal, sclerae anicteric  EXT: warm and well perfused, no edema SKIN: No rashes    OBJECTIVE DATA  LABS:  Results for orders placed or performed in visit on 06/27/23  Comprehensive Metabolic Panel  Result Value Ref Range   Sodium 143 135 - 145 mmol/L   Potassium 4.1 3.4 - 4.8 mmol/L   Chloride 104 98 - 107 mmol/L   CO2 27.7 20.0 - 31.0 mmol/L   Anion Gap 11 5 - 14 mmol/L   BUN 19 9 - 23 mg/dL   Creatinine 9.29 9.44 - 1.02 mg/dL   BUN/Creatinine Ratio 27    eGFR CKD-EPI (2021) Female 88 >=60 mL/min/1.71m2   Glucose 101 70 - 179 mg/dL   Calcium  9.7 8.7 - 10.4 mg/dL   Albumin 3.6 3.4 - 5.0 g/dL   Total Protein 7.0 5.7 - 8.2 g/dL   Total Bilirubin 0.3 0.3 - 1.2 mg/dL   AST 21 <=65 U/L   ALT 19 10 - 49 U/L   Alkaline Phosphatase 97 46 - 116 U/L  Cancer Antigen 19-9  Result Value Ref Range   CA 19-9 1,486.71 (H) 0 - 35 U/mL  CBC w/ Differential  Result Value Ref Range   WBC 9.5 3.6 - 11.2 10*9/L   RBC 3.31 (L) 3.95 - 5.13 10*12/L   HGB 10.0 (L) 11.3 - 14.9 g/dL   HCT 70.7 (L) 65.9 - 55.9 %   MCV 88.4 77.6 - 95.7 fL   MCH 30.1 25.9 - 32.4 pg   MCHC 34.1 32.0 - 36.0 g/dL   RDW 82.2 (H) 87.7 - 84.7 %   MPV 7.7 6.8 - 10.7 fL   Platelet 267 150 - 450 10*9/L   nRBC 0 <=4 /100 WBCs   Neutrophils % 74.0 %   Lymphocytes % 15.6 %   Monocytes % 7.3 %   Eosinophils % 2.5 %   Basophils % 0.6 %   Absolute Neutrophils 7.0 1.8 -  7.8 10*9/L   Absolute Lymphocytes 1.5 1.1 - 3.6 10*9/L   Absolute Monocytes 0.7 0.3 - 0.8 10*9/L   Absolute Eosinophils 0.2 0.0 - 0.5 10*9/L   Absolute Basophils 0.1 0.0 - 0.1 10*9/L   Anisocytosis Slight (A) Not Present   RADIOLOGY RESULTS  CT Chest Wo Contrast Narrative: EXAM: CT CHEST WO CONTRAST ACCESSION: [id number] [facility] REPORT DATE: 06/27/2023 12:03 PM  CLINICAL INDICATION: restaging  - C25.3 - Adenocarcinoma of pancreatic duct   TECHNIQUE: Contiguous noncontrast axial images were reconstructed through the chest following a single breath hold helical acquisition. Images were reformatted in the  axial. coronal, and sagittal planes. MIP slabs were also constructed.  COMPARISON: 04/27/2023 CT chest without contrast  FINDINGS:  LUNGS/AIRWAYS/PLEURA: Unchanged micronodules in both lungs. No new or growing pulmonary nodules. Lungs are otherwise clear. Central airways are patent. No pleural effusion or pneumothorax.  MEDIASTINUM/THORACIC INLET: No mediastinal or hilar lymphadenopathy. No mediastinal mass or thyroid abnormality.  HEART/VASCULATURE: Cardiac chambers are normal in size. Mild coronary artery calcifications. No pericardial effusion. Unchanged mild dilation of the ascending thoracic aorta measuring 4.3 cm at the level the right pulmonary artery. Moderate atherosclerotic calcifications of the thoracic aorta with tortuous course. Main pulmonary artery is normal in size.   UPPER ABDOMEN: Reported separately.  CHEST WALL/BONES: No enlarged axillary lymph nodes. No suspicious lytic or sclerotic osseous lesions. Multilevel degenerative changes of thoracic spine. Diffuse osseous demineralization with ossification anterior longitudinal ligament. Chest wall appears normal.  DEVICES: Right internal jugular port catheter tip is at the level of the superior cavoatrial junction. Impression: No thoracic metastatic disease. CT Abdomen Pelvis W Contrast Narrative: EXAM: CT ABDOMEN PELVIS W  CONTRAST ACCESSION: [id number] [facility] REPORT DATE: 06/27/2023 11:08 AM CLINICAL INDICATION: 78 years old with restaging  - C25.3 - Adenocarcinoma of pancreatic duct    COMPARISON: CT abdomen and pelvis dated 04/27/2023 and priors  TECHNIQUE: A helical CT scan of the abdomen and pelvis was obtained following IV contrast from the lung bases through the pubic symphysis. Images were reconstructed in the axial plane. Coronal and sagittal reformatted images were also provided for further evaluation.   FINDINGS:  LOWER CHEST: Please see dedicated CT chest for characterization of findings above the diaphragm.  LIVER: Normal liver contour. Multiple new ill-defined hypoenhancing bilobar hepatic lesions, for example measuring 1.8 cm at the left hepatic dome (3:20) and 1.6 cm in hepatic segment 5 (3:63).  BILIARY: The gallbladder is surgically absent. Similar mild intrahepatic biliary ductal dilatation and left-sided pneumobilia.  SPLEEN: Normal in size and contour. Small splenule.  PANCREAS: Status post Whipple procedure. Ill-defined soft tissue density and mesenteric soft tissue stranding along the surgical bed appears overall worsened. This tissue extends anteriorly to abut the anterior abdominal wall and encase branches of the SMV, along the medial margin of the left hepatic lobe and extending posteriorly to partially encase the main portal vein, and SMA as well as abuts the anterior aspect of the infrarenal IVC and infrarenal abdominal aorta (3:50). Similar extension into the porta hepatis with encasement of the common hepatic artery, proper hepatic artery and proximal left hepatic artery. Increased size of a fluid collection adjacent to the pancreaticojejunostomy measuring 3.4 x 2.3 cm, previously 2.6 x 1.4 cm and additional new adjacent fluid collection measuring 3.2 x 2.0 cm (3:42). Slightly increased main pancreatic ductal dilatation measuring up to 0.7 cm, previously 0.6 cm. Diffuse pancreatic  atrophy.  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 Normal right adrenal gland.  KIDNEYS/URETERS: Symmetric renal enhancement.  No hydronephrosis. Similar 1.3 cm right interpolar renal cyst. Scattered subcentimeter bilateral renal hypodensities, too small to characterize. Bilateral nonobstructing renal calculi.  BLADDER: Evaluation of the pelvis is limited due to beam hardening artifact from bilateral hip arthroplasties. The bladder demonstrates circumferential wall thickening, similar to prior.  REPRODUCTIVE ORGANS: Calcified uterine fibroids.  GI TRACT: Small hiatal hernia. Postsurgical changes of Whipple procedure. No findings of bowel obstruction. Thickening of the ascending colon and hepatic flexure colon may be related to underdistention. Normal appendix.  PERITONEUM, RETROPERITONEUM AND MESENTERY: No free air.  No ascites.  No fluid collection.  LYMPH NODES: Prominent mesenteric and retroperitoneal lymph nodes, similar to prior. Reference left periaortic lymph node measures 0.7 cm, previously 0.8 cm (3:39).  VESSELS: Hepatic and portal veins are patent. Vascular involvement by mesenteric soft tissue as described above, with increased mild narrowing of the SMV near the portal splenic confluence. Normal caliber aorta. Mild atherosclerotic calcifications.  BONES and SOFT TISSUES: No aggressive osseous lesions. Multilevel degenerative changes of the spine. Bilateral total hip arthroplasties. Postsurgical changes of the anterior abdominal wall. Impression: Since 04/27/2023: --Multiple new ill-defined hypoenhancing bilobar hepatic lesions suggestive of metastatic disease. --Status post Whipple procedure with enlarging soft tissue density with vascular encasement as described as well as new and enlarging fluid collections at the surgical site concerning for residual/recurrent malignancy. --Please see separately dictated CT chest for findings above the diaphragm.     CARE TEAM    [facility]: Dr. Carleton Ip  FOLLOW UP   Future Appointments  Date Time Provider Department Center  07/10/2023  8:30 AM [facility] NURSE MULTI ONCMULTI [place]  07/10/2023  9:30 AM Mitchel France, MD [facility] [place]  07/10/2023 10:15 AM ONCINF CHAIR 50 [id number] [place]  07/31/2023 10:00 AM ADULT ONC LAB [facility] [place]  07/31/2023 11:00 AM Mitchel France, MD [id number] [place]  08/14/2023  8:00 AM ADULT ONC LAB [facility] [place]  08/14/2023  9:00 AM Jia, Jingquan, MD [id number] [place]

## 2023-07-18 NOTE — Progress Notes (Signed)
 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  She is currently undergoing a new chemotherapy regimen for pancreatic cancer with liver metastasis. She has experienced significant weight loss over the past year, but her weight has recently stabilized. She is taking Ensure supplements twice daily to help maintain her weight. She reports episodes of severe diarrhea, which she attributes to dietary choices, such as consuming peanuts and fried foods. She uses a pump for 46 hours post-infusion and has tolerated the treatment well overall, despite these gastrointestinal symptoms.  Her hypertension is well-controlled with home monitoring. She reports no chest pain, dyspnea, or falls.  Her sleep apnea is managed with a CPAP machine, which she uses nightly. Despite significant weight loss, she continues to find the CPAP effective and reports no issues with sleep apnea symptoms.  She has been evaluated by neurology and diagnosed with mild cognitive impairment. She is on memory medication and has not noticed any significant changes in her memory recently.  Her mood and anxiety are stable, although she acknowledges having days where she questions the reality of her situation. She feels blessed to have time with her family and is taking things one day at a time.  Medicare Wellness Visit  Providers [facility] Dr. Ophelia Klein-Internal Medicine Dr. Alm Kowalski-dermatology [facility] Dr. Theotis- pulmonology [facility] Dr. Johnye Dr. Maree- Neurology Dr. Blair- ENT [facility]   Functional Assessment (1) Hearing: Demonstrates no difficulty in hearing during normal conversation (2) Risk of Falls: Patient denies any falls or near falls in the last year (3) Home Safety: Patient feels secure in their home. There are operational smoke alarms in  multiple areas of the home. (4) Activities of Daily Living: Independently manages personal grooming and household chores, including cooking, cleaning and laundry.  Manages Personal finances without assistance.    Depression Screening PHQ 2/9 last 3 flowsheet values     07/13/2022    9:44 AM 07/09/2023   10:09 AM 07/19/2023    8:57 AM  PHQ-2/9 Depression Screening   Little interest or pleasure in doing things 0 0 0  Feeling down, depressed, or hopeless 0 0 0  Patient Health Questionnaire-2 Score 0 0 0     Depression Severity and Treatment Recommendations:  0-4= None  5-9= Mild / Treatment: Support, educate to call if worse; return in one month  10-14= Moderate / Treatment: Support, watchful waiting; Antidepressant or Psychotherapy  15-19= Moderately severe / Treatment: Antidepressant OR Psychotherapy  >= 20 = Major depression, severe / Antidepressant AND Psychotherapy   Cognitive impairment Short-term memory deficits, with mild progression   Prevention Plan  Item name                              Frequency        Month Due       Year Due Health Maintenance  Topic Date Due  . COVID-19 Vaccine (8 - Pfizer risk 2024-25 season) 06/13/2023  . Medicare Subsequent AWV [id number]  07/14/2023  . Annual Physical/Well Child Check  07/14/2023  . RSV Immunization Pregnant or 60+ (1 - 1-dose 75+ series) 05/21/2024 (Originally 04/13/2018)  . TSH Level  01/04/2024  . Lipid Panel  01/04/2024  . Annual Urine Albumin Creatinine Ratio  01/04/2024  . Creatinine Level  03/14/2024  . Potassium Level  03/14/2024  . Serum Calcium   03/14/2024  . Mammogram  06/05/2024  . Depression Screening  07/18/2024  . Diabetes Screening  03/14/2026  . Adult Tetanus (Td And Tdap)  04/16/2028  . Pneumococcal Vaccine: 50+  Completed  . Shingrix  Completed  . Hepatitis C Screen  Completed  . Influenza Vaccine  Completed  . Hib Vaccines  Aged Out  . Hepatitis A Vaccines  Aged Out  . Meningococcal B Vaccine  Aged  Out  . Meningococcal ACWY Vaccine  Aged Out  . HPV Vaccines  Aged Out  . DXA Bone Density Scan  Discontinued  . Colorectal Cancer Screening  Discontinued    Other personalized health advice Encouraged patient to exercise regularly.  Encouraged attention to diet with good intake of fruits, vegetables, and limitation of red meat to 2 times a week or less    End of Life Counseling Patient has a living will in place. Patient is DNR.    Alcohol Screening: Alcohol Use: Not At Risk (07/19/2023)   AUDIT-C   . Frequency of Alcohol Consumption: Never   . Average Number of Drinks: Patient does not drink   . Frequency of Binge Drinking: Never      Current Outpatient Medications  Medication Sig Dispense Refill  . acetaminophen  (TYLENOL ) 650 MG ER tablet Take 1,300 mg by mouth 2 (two) times daily as needed    . amLODIPine  (NORVASC ) 5 MG tablet Take 1 tablet (5 mg total) by mouth 2 (two) times daily 180 tablet 1  . CALCIUM  CARBONATE-VITAMIN D3 ORAL Take 1 tablet by mouth once daily 600 mg    . carvediloL  (COREG ) 6.25 MG tablet Take 1 tablet (6.25 mg total) by mouth 2 (two) times daily with meals 60 tablet 5  . citalopram  (CELEXA ) 20 MG tablet Take 1 tablet (20 mg total) by mouth every morning 90 tablet 1  . galantamine  (RAZADYNE  ER) 16 MG ER capsule Take 1 capsule (16 mg total) by mouth daily with breakfast 90 capsule 0  . levothyroxine  (SYNTHROID ) 137 MCG tablet TAKE ONE TABLET DAILY ON EMPTY STOMACH. WAIT 30 MINUTES BEFORE TAKING OTHER MEDS. 90 tablet 1  . melatonin 10 mg Tab Take 10 mg by mouth at bedtime    . memantine  (NAMENDA ) 10 MG tablet Take 1 tablet (10 mg total) by mouth 2 (two) times daily for 360 days 180 tablet 0  . traZODone  (DESYREL ) 50 MG tablet Take 2 tablets (100 mg total) by mouth at bedtime as needed for Sleep 180 tablet 0   No current facility-administered medications for this visit.    Allergies as of 07/19/2023 - Reviewed 07/19/2023  Allergen Reaction Noted  .  Lycopene-lutein-fruit extract Hives 01/05/2015  . Buprenorphine hcl Nausea and Vomiting 01/05/2015  . Donepezil  Other (See Comments) 11/11/2018  . Morphine  Nausea and Vomiting 07/21/2013    Past Medical History:  Diagnosis Date  . Anemia   . Anesthesia complication    Pt had to be given narcan after biliary stent placement. Sensitive to opiate medications  . Arthritis   . Biliary obstruction due to malignant neoplasm (CMS/HHS-HCC) 09/17/2022  . Carpal tunnel syndrome   . COPD (chronic obstructive pulmonary disease) (CMS/HHS-HCC)   . Depression   . GERD (gastroesophageal reflux disease)   . Hematuria, microscopic 07/01/2020   CT 3/22 with bilateral nonobstructing renal stones  . History of blood transfusion    with hip replacement  . History of cancer    pancreatic cancer; also skin cancer-followed by Derm  .  History of constipation 03/15/2018  . Hyperlipidemia   . Hypertension   . Hypothyroidism   . Metabolic syndrome   . MRSA (methicillin resistant Staphylococcus aureus)   . Obesity   . Personal history of colonic polyps 03/15/2018  . Psoriasis   . Sleep apnea     Past Surgical History:  Procedure Laterality Date  . Left total hip arthroplasty  12/16/2003  . Right total hip arthroplasty  05/15/2006  . Right total knee arthroplasty using computer-assisted navigation  06/14/2011   Dr Mardee  . COLONOSCOPY  01/02/2013   Adenomatous Polyp; FHCP (Sister) CBF 12/2017 Recall ltr mailed 12/11/17  . COLONOSCOPY  08/16/2018   Adenomatous Polyps; FHCP (Sister) CBF 07/2021  . Left total knee arthroplasty using computer-assisted navigation  09/20/2018   Dr Mardee  . COLONOSCOPY  04/19/2022   PHx Adenomatous Polyps/No Repeat/TKT  . ERCP N/A 09/19/2022   Procedure: ERCP and Upper EUS;  Surgeon: Vicci Renaee Fee, MD;  Location: [facility] OR;  Service: Gastroenterology;  Laterality: N/A;  . CORI KAPUR EXAMINATION N/A 09/19/2022   Procedure: UPPER EUS;   Surgeon: Vicci Renaee Fee, MD;  Location: [facility] OR;  Service: Gastroenterology;  Laterality: N/A;  . PANCREATICODUODENECTOMY W/PANCREATICOJEJUNOSTOMY WHIPPLE N/A 02/08/2023   Procedure: (Open Whipple) Pancreatectomy, proximal subtotal with total duodenectomy, partial gastrectomy, choledochoenterostomy and gastrojejunostomy;  Surgeon: Elza Carleton Senior, MD;  Location: [facility] OR;  Service: General Surgery;  Laterality: N/A;  . REPAIR BLOOD VESSEL INTRA-ABDOMINAL  02/08/2023   Procedure: direct repair of the superior mesenteric vein;  Surgeon: Elza Carleton Senior, MD;  Location: [facility] OR;  Service: General Surgery;;  . CHOLECYSTECTOMY     1980s   Social History   Tobacco Use  . Smoking status: Never  . Smokeless tobacco: Never  Vaping Use  . Vaping status: Never Used  Substance Use Topics  . Alcohol use: No    Alcohol/week: 0.0 standard drinks of alcohol  . Drug use: Never   Family History  Problem Relation Name Age of Onset  . High blood pressure (Hypertension) Mother    . Arthritis Mother    . Colon polyps Sister    . Anesthesia problems Neg Hx    . Malignant hypertension Neg Hx    . Malignant hyperthermia Neg Hx    . Pseudochol deficiency Neg Hx    . PONV Neg Hx      Goals Addressed   None       BP 120/74   Pulse 67   Ht 154.9 cm (5' 1)   Wt 70.2 kg (154 lb 12.8 oz)   LMP  (LMP Unknown)   SpO2 94%   BMI 29.25 kg/m   General: Pleasant patient, in no acute distress HEENT: Pupils equal and round, EOMI.  OP moist without lesions.  TMs clear Neck:  trachea midline; no thyromegaly Lungs: Reduced airflow in the bases without wheeze or retractions Cardiac:  Regular rate and rhythm without murmur, gallops, or rubs Vascular: Carotid and radials 2+; distal pulses 2+ Breast: Patient defers Abdominal: soft, nontender, distended.  Well-healed postoperative changes in the midline.  Port-A-Cath right chest wall.  Motor and no guard or rebound Genitalia/Rectal: Patient  defers Extremities:  No clubbing, cyanosis.  Postoperative changes left knee.  No significant edema.  No foot lesions Neuro: CN grossly intact.  Sensory exams appear intact and symmetrical with mildly antalgic gait. Derm: Solar damage.     Malignant neoplasm of pancreas, unspecified location of malignancy (CMS/HHS-HCC)  (primary encounter diagnosis) Metastasis to  liver (CMS/HHS-HCC) Adenocarcinoma of pancreatic duct (CMS/HHS-HCC) Primary hypertension Other emphysema (CMS/HHS-HCC) Immunosuppression (CMS/HHS-HCC) Prediabetes Hypothyroidism due to acquired atrophy of thyroid Other hyperlipidemia Moderate protein-calorie malnutrition (CMS/HHS-HCC) Chronic gouty arthritis Depression screening (Z13.31) Routine general medical examination at a health care facility DNR (do not resuscitate)  Assessment & Plan Pancreatic cancer with liver metastasis Undergoing chemotherapy with rising CA19-9 levels. Acknowledged significant challenges and limited life expectancy. No severe pain reported. - Follow up with oncology as planned.  At this point she is very comfortable, eating, drinking well and is aware that this she may let her treatment team know if she begins having too much issues with her treatments in terms of quality of life.  It sounds like this has been made clear to her through their team as well and appreciate their excellent care  Moderate protein-calorie malnutrition Weight stabilized with regular nutritional supplements. Electrolytes and protein levels normal. - Continue regular intake of nutritional supplements. - Monitor weight and nutritional status.  Hypertension Hypertension well controlled on current regimen. Monitor for hypotension due to weight loss, diarrhea, or decreased oral intake. Kidney function and electrolytes monitored through cancer center. - Monitor blood pressure closely. - Adjust antihypertensive medications if hypotension  occurs.  Hypothyroidism Hypothyroidism managed on current levothyroxine  dose. Periodic thyroid function monitoring needed. - Continue current dose of levothyroxine . - Monitor thyroid function periodically.  Recurrent major depressive disorder, in full remission Depression and anxiety well managed on current regimen. Will notify if symptoms worsen. - Continue current regimen. - Monitor for any worsening of symptoms.  Mild cognitive impairment Mild cognitive impairment present. Continues on memory medication. Discussed potential effects of stopping medication and risk of not regaining memory if restarted. - Continue memory medication. - Discuss any changes in memory or decision to stop medication.  Chronic gouty arthritis No recent gout events reported.  Encourage good hydration and she will let us  know if there are flares  Dyslipidemia Dyslipidemia not actively managed unless symptoms arise due to limited treatment options given current health status.  Goals of Care Acknowledges limited life expectancy and desires to spend remaining time with family. Accepts prognosis and focuses on quality time. Has a living will and does not wish to be resuscitated. Concerned about husband's well-being post-passing and making arrangements for his care. - Support discussions with family regarding end-of-life care.  CODE STATUS.  Patient is DNR.  Out of facility DNR form provided     BERT JACK KLEIN III, MD  Portions of this note were created using dictation software and may contain typographical errors.   This note has been created using automated tools and reviewed for accuracy by BERT JACK KLEIN III.

## 2023-08-02 NOTE — ED Provider Notes (Signed)
 [facility] Emergency Department Provider Note    ED Clinical Impression   Final diagnoses:  Port-A-Cath in place (Primary)    HPI, ED Course, Assessment and Plan   Initial Clinical Impression:  August 02, 2023 7:02 PM  Victoria Flynn is a 78 y.o. female with past medical history of pretension, hyperlipidemia, hypothyroidism, OSA, depression, prediabetes, emphysema, arthritis, pancreatic adenocarcinoma complicated by biliary obstruction status post ERCP and CBD stent placement currently on chemo presenting with port problem.  Patient had an infusion done in clinic a few days ago and went home with a pump.  Somehow she accidentally deaccessed her pump without flushing it with heparin .  She is presenting to the emergency department to have her port reaccessed and to have heparin  flushed through the line.  She denies any fevers, chills, nausea, vomiting, diarrhea and otherwise feels well today.  She denies any redness or swelling at the site.  BP 184/101   Pulse 60   Temp 36.8 C (98.2 F)   Resp 17   Wt 69.4 kg (153 lb)   SpO2 94%   BMI 28.91 kg/m   Medical Decision Making Patient presenting with Port-A-Cath problem.  She is otherwise well and has no indication for labs or other imaging at this time.  She will have her port reaccessed and have it flushed with heparin  and then be able to be discharged.  She is going to follow-up with her normal hematologist and has no other symptoms at this time.     Further ED updates and updates to plan as per ED Course below:  ED Course: ED Course as of 08/02/23 1936  Thu Aug 02, 2023  1919 Port reaccessed and flushed by [employer]; will dc now; pt advised to monitor for signs of infection;     Social Drivers of Health with Concerns   Physical Activity: Not on file  Stress: Not on file  Substance Use: Not on file (12/25/2022)  Social Connections: Not on file   _____________________________________________________________________  Additional  Medical Decision Making   I have reviewed the vital signs and the nursing notes. Labs and radiology results that were available during my care of the patient were independently reviewed by me and considered in my medical decision making.  I independently visualized the EKG tracing if performed I independently visualized the radiology images if performed I reviewed the patient's prior medical records if available. Additional history obtained from family if available. For specific reads/information impacting care please refer to MDM/ED Course continued documentation  Past History   PAST MEDICAL HISTORY/PAST SURGICAL HISTORY:  Past Medical History[1]  Past Surgical History[2]  MEDICATIONS:  No current facility-administered medications for this encounter.  Current Outpatient Medications:  .  acetaminophen  (TYLENOL  8 HOUR) 650 MG CR tablet, Take 2 tablets (1,300 mg total) by mouth two (2) times a day as needed for pain., Disp: , Rfl:  .  amLODIPine  (NORVASC ) 5 MG tablet, Take 1 tablet (5 mg total) by mouth daily., Disp: , Rfl:  .  aspirin (ECOTRIN) 81 MG tablet, Take 1 tablet (81 mg total) by mouth., Disp: , Rfl:  .  calcium -vitamin D  500 mg(1,250mg ) -200 unit per tablet, Take by mouth., Disp: , Rfl:  .  carvedilol  (COREG ) 6.25 MG tablet, Take 1 tablet (6.25 mg total) by mouth., Disp: , Rfl:  .  citalopram  (CELEXA ) 10 MG tablet, Take 1 tablet (10 mg total) by mouth daily., Disp: , Rfl:  .  diclofenac sodium (VOLTAREN) 1 % gel,  Apply 4 g topically 4 (four) times a day as needed for arthritis or pain. Apply 2g to small joints, 4g to large joints, up to QID as needed., Disp: 300 g, Rfl: prn .  fluticasone propionate (FLONASE) 50 mcg/actuation nasal spray, 2 sprays into each nostril two (2) times a day. Dispense 2 bottles equals one month supply (Patient not taking: Reported on 07/31/2023), Disp: 32 g, Rfl: 12 .  galantamine  (RAZADYNE  ER) 16 MG 24 hr capsule, , Disp: , Rfl:  .  hydroCHLOROthiazide   (HYDRODIURIL ) 25 MG tablet, Take 1 tablet (25 mg total) by mouth daily., Disp: , Rfl:  .  ibuprofen (ADVIL,MOTRIN) 200 MG tablet, Take 1 tablet (200 mg total) by mouth every six (6) hours as needed for pain., Disp: , Rfl:  .  levothyroxine  (SYNTHROID ) 137 MCG tablet, TAKE ONE TABLET DAILY ON EMPTY STOMACH. WAIT 30 MINUTES BEFORE TAKING OTHER MEDS., Disp: , Rfl:  .  lidocaine -prilocaine (EMLA) 2.5-2.5 % cream, Apply topically daily as needed. Apply to healed, intact skin only (to decrease pain associated with port access). (Patient not taking: Reported on 07/31/2023), Disp: 30 g, Rfl: 0 .  loperamide (IMODIUM A-D) 2 mg tablet, If having diarrhea, take 2 tablets (4 mg) by mouth after the first loose stool, then 1 tablet (2 mg) every 2 hours until diarrhea free for 12 hours., Disp: 48 tablet, Rfl: 11 .  LORazepam  (ATIVAN ) 0.5 MG tablet, Take 1 tablet (0.5 mg total) by mouth., Disp: , Rfl:  .  losartan  (COZAAR ) 100 MG tablet, Take 1 tablet (100 mg total) by mouth daily at 0600., Disp: , Rfl:  .  melatonin 5 mg Tab, Take 1-2 tablets (5-10 mg total) by mouth., Disp: , Rfl:  .  memantine  (NAMENDA ) 10 MG tablet, , Disp: , Rfl:  .  MISCELLANEOUS MEDICAL SUPPLY MISC, by Miscellaneous route. c pap machine, Disp: , Rfl:  .  ondansetron  (ZOFRAN ) 8 MG tablet, Take 1 tablet (8 mg total) by mouth every eight (8) hours as needed for nausea. Do not take on day 1 of chemotherapy cycles., Disp: 60 tablet, Rfl: 3 .  prochlorperazine (COMPAZINE) 10 MG tablet, Take 1 tablet (10 mg total) by mouth every six (6) hours as needed (Nausea/Vomiting)., Disp: 30 tablet, Rfl: 2 .  traZODone  (DESYREL ) 50 MG tablet, Take 2 tablets (100 mg total) by mouth daily as needed., Disp: , Rfl:  .  triamcinolone  (KENALOG ) 0.1 % cream, , Disp: , Rfl:   ALLERGIES:  Fruit and vegetable daily [lycopene-lutein-fruit extract], Other, Buprenorphine hcl, Donepezil , and Morphine   SOCIAL HISTORY:  Social History   Tobacco Use  . Smoking status:  Never  . Smokeless tobacco: Never  Substance Use Topics  . Alcohol use: No    Alcohol/week: 0.0 standard drinks of alcohol    FAMILY HISTORY: Family History[3]    Review of Systems   A review of systems was performed and relevant portions were as noted above in HPI   Physical Exam   VITAL SIGNS:   BP 184/101   Pulse 60   Temp 36.8 C (98.2 F)   Resp 17   Wt 69.4 kg (153 lb)   SpO2 94%   BMI 28.91 kg/m    Constitutional:  Alert and oriented.  Head:  Normocephalic and atraumatic Eyes:  Conjunctivae are normal. ENT:  No notable congestion, Mucous membranes moist, no notable stridor Cardiovascular:  RRR, no murmurs. Extremities warm and well perfused; port present to the R anterior chest; no surrounding redness or  tenderness Respiratory:  Normal respiratory effort. Breath sounds are normal. Gastrointestinal:  Soft, non-distended, and nontender.  Genitourinary:  Deferred Musculoskeletal:   Normal range of motion in all extremities. No tenderness or edema noted in B/L lower extremities Neurologic:  No gross focal neurologic deficits beyond baseline are appreciated. Skin:  Skin is warm, dry and intact.    Radiology   No orders to display    Labs   Labs Reviewed - No data to display   Pertinent labs & imaging results that were available during my care of the patient were reviewed by me and considered in my medical decision making (see chart for details).  Please note- This chart has been created using [employer]. Chart creation errors have been sought, but may not always be located and such creation errors, especially pronoun confusion, do NOT reflect on the standard of medical care.       [1] Past Medical History: Diagnosis Date  . HTN (hypertension)   . Hypothyroidism   . Psoriasis   [2] Past Surgical History: Procedure Laterality Date  . CATARACT EXTRACTION Bilateral 2016  . CHOLECYSTECTOMY  1998  . HIP SURGERY Right 2005  . HIP  SURGERY Left 2008  . IR INSERT PORT AGE GREATER THAN 5 YRS  10/24/2022   IR INSERT PORT AGE GREATER THAN 5 YRS 10/24/2022 Yu, Hyeon, MD IMG IR NELS [id number]  . KNEE SURGERY Right 2013  [3] Family History Problem Relation Age of Onset  . Hypertension Mother   . Arthritis Mother   . Hypertension Father   . Hypertension Sister   . Arthritis Sister   . Heart disease Paternal Grandfather   . Hypertension Paternal Apolinar Darin Rocky Almarie, MD 08/02/23 1940

## 2023-09-04 NOTE — Progress Notes (Signed)
 [facility] ePROs Program: Outreach Call Completed with: Patient  Reason for Outreach call: Patient has not completed 2 consecutive weekly eSyM symptom trackers. OPN noticed messages not being read in mychart and wanted to confirm patient had access.   Oncology Patient Navigator (OPN) completed a proactive outreach call. OPN reviewed the role of Adult nurse and provided a detailed introduction to eSyM series.  OPN reinforced expectations for upcoming eSyM symptom trackers including: when to expect symptom tracker availability in MyChart, advised tip sheets automatically sent to patient after submission based on symptom alerts, follow-up from care team, and appropriate points of contact. Reinforced triage and after hours contact information and encouraged patient to call for immediate medical concerns.   eSyM outreach coordinator/OPN is available to assist patient with any possible technological issues related to [facility] or MyChart access.   eSyM Navigation Care Plan: Patient verbalized understanding of information provided and expressed appreciation for proactive support. OPN will follow-up with patient as needed.

## 2023-09-11 NOTE — Progress Notes (Signed)
 [facility] RETURN VISIT     PATIENT IDENTIFICATION  Victoria Flynn is a 78 y.o. year old female with a PMH significant for HTN, HLD, hypothyroidism, OSA, depression, prediabetes, emphysema, psoriasis, arthritis and pancreatic adenocarcinoma c/b malignant biliary obstruction s/p ERCP and CBD stent placement s/p 73m neoadjuvant Gem/Abraxane followed by Whipple resection on 02/08/23 followed by 60m of adjuvant gem/abraxane with recurrence.    CANCER STAGING   Hematology/Oncology History  Adenocarcinoma of pancreatic duct     09/19/2022 -  Cancer Staged   Staging form: Exocrine Pancreas, AJCC 8th Edition - Clinical stage from 09/19/2022: Stage IA (cT1c, cN0, cM0) - Signed by Jia, Jingquan, MD on 10/16/2022    10/16/2022 Initial Diagnosis   Pancreatic adenocarcinoma (CMS-HCC)       ONCOLOGIC HISTORY   Pancreatic adenocarcinoma, resectable 09/12/22, presented to PCP with epigastric pain/fulness and new onset jaundice. Labs notable for AST 213, ALT 282, Alk Phos 373, Tbili 11.3. CT AP showed s/p cholecystectomy. Marked intra and extrahepatic biliary dilatation with interval pancreatic ductal dilatation, concerning for biliary obstruction. Possible ill-defined hypodense  mass at the uncinate process of pancreas though there is ill-defined soft tissue density as well in the anticipated region of the ampulla. B/l nonobstructing kidney stones. Large uterine fibroid. 14 mm L adrenal nodule containing fat c/w small myelolipoma.  09/18/22, MRCP showed marked diffuse intra and extra biliary ductal dilatation measuring up to 2 cm and the CBD with abrupt narrowing at the pancreatic head. Hypoenhancing, diffusion restriction uncinate process mass  measuring 1.1 x 1.1 cm with upstream dilatation of the main pancreatic duct. The SMA, celiac axis and common hepatic arteries are not involved. Less than 180 degrees abutment of the SMV. No metastatic disease noted.  CT CAP dual panc protocol showed numerous  b/l pulmonary micronodules. 09/19/22, EUS showed a mass in the uncinate process of the pancreas. Biopsy of the mass showed adenocarcinoma. ERCP showed a localized abnormal mucosal changes in the 2nd portion of the duodenum lateral and proximal to the ampulla c/f tumor invasion. A single severe biliary stricture was found in the lower third of the main bile duct with marked upstream dilation. The stricture was malignant appearing. Biliary sphincterotomy peformed. Fully covered metal stent placed in CBD.  Course c/b post ERCP pancreatitis.  09/22/22, Tbili 4.4 mg/dL, AST 49, ALT 893, Alk Phos 241 10/17/22, initial consultation with [facility] 10/27/22, Initiated on Gem/Abraxane with abraxane dose reduced to 100 mg/m2; CA 19-9=306.1 01/05/23, C3D15 Gem/Abraxane. CA 19-9=43.27 02/08/23, Whipple surgery. Post op course c/b intra-abdominal infection/abscess requiring pigtail train on IR drain placement and antibiotics. Final path showed a 5 cm moderately differentiated ductal adenocarcinoma in the pancreatic head extending to duodenal wall and peripancreatic soft tissue, partial treatment response, +LVI, +PNI, invasive carcinoma present at SMV margin, low grade PanIN present at pancreatic margin, 6/16 LNs involved; ypT3N2  02/22/23, CT AP w/ contrast showed improvement in L hepatic abscess without apparent evidence of recurrence/metastasis.  04/06/23, Gem/Abraxane resumed. CA 19-9=431.4 06/15/23, completed 74m of adjuvant gem/abraxane  06/27/23, CT CAP showed multiple ill-defined hypoenhancing bilobar hepatic lesions c/f metastatic disease. S/p Whipple with enlarging soft tissue density with vascular encasement as well as new and enlarging fluid collections at the surgical site c/f residual/recurrent malignancy. CA 19-9=1486.71 07/08/23, DPYD activity score=2 07/10/23, Initiate FOLFIRI with 5FU at 2000 mg/m2 and irinotecan at 150 mg/m2 for better tolerability; CA 19-9=3276.93 09/03/23, CT CAP showed new/enlarging hepatic  metastasis and worsening soft tissue density in surgical  bed c/w PD. Worsened mesenteric stranding with a few tiny anterior peritoneal nodules could represent early peritoneal carcinomatosis.  09/11/23, Initiated on FOLFOX; CA 19-9 pending   MOLECULAR DIAGNOSTICS   [facility] xT   ASSESSMENT AND PLAN    # Pancreatic adenocarcinoma, resectable. S/p 70m neoadjuvant Gem/Abraxane followed by Whipple resection on 02/08/23 followed by 36m of adjuvant gem/abraxane unfortunately with local disease recurrence and metastasis to the liver immediately after adjuvant Gem/Abraxane now s/p FOLFIRI with PD.   She is doing ok. Unfortunately CT showed PD and CA 19-9 has been rising. Will proceed with FOLFOX today. Labs are adequate to treat.   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  - Continue to monitor    # Weight loss: stable.  - Advised patient to increase protein and calorie intake as much as able - Patient declined RD consult.   # Lung nodules: stable from 2019.  - Continue to monitor   # Port-a-cath in place: - EMLA cream   #Psoriasis Previously on Enbrel and currently on Otezla  (apremilast ) due to several infections while on immuno modulators. Otezla  is PDE4 inhibitor and selective immunosuppressant. - Continue Otezla  per dermatology  Orders Placed This Encounter  Procedures  . Cancer Antigen 19-9  . Cancer Antigen 19-9  . Cancer Antigen 19-9  . Cancer Antigen 19-9    The natural history of cancer was reviewed and discussed with the patient and family members. Treatment options as well as risks and benefits were discussed with the patient/family. Patient and family member's questions answered to their satisfaction  I spent a total of 40 minutes in  both face-to-face and non-face-to-face activities for this visit on the date of this encounter. Medical decision making based high complexity of cancer and treatment risk of complications and monitoring toxicity of chemotherapy.     INTERVAL HISTORY  SELYNA KLAHN is here for follow up. She is doing fine. Continues to experience some intermittent abdominal cramps but overall doing well. No complains.    REVIEW OF SYSTEMS  The remainder of a comprehensive 10 systems review is negative.   Allergies and Medications reviewed as per chart  PHYSICAL EXAMS   Vitals:   09/11/23 1050  BP: 168/77  Pulse: 57  Resp: 19  Temp: 36.2 C (97.2 F)  SpO2: 99%           09/11/23 67.9 kg (149 lb 11.1 oz)  08/28/23 68.3 kg (150 lb 9.2 oz)  08/14/23 68.7 kg (151 lb 8 oz)    ECOG: (2) Ambulatory and capable of self care, unable to carry out work activity, up and about > 50% or waking hours  GENERAL: well developed, well nourished, in no distress PSYCH: full and appropriate range of affect with good insight and judgement HEENT: NCAT, pupils equal, sclerae anicteric  EXT: warm and well perfused, no edema SKIN: No rashes    OBJECTIVE DATA  LABS:  Results for orders placed or performed in visit on 09/11/23  Comprehensive Metabolic Panel  Result Value Ref Range   Sodium 147 (H) 135 - 145 mmol/L   Potassium 3.8 3.5 - 5.1 mmol/L   Chloride 111 (H) 98 - 107 mmol/L   CO2 26.0 20.0 - 31.0 mmol/L   Anion  Gap 10 5 - 14 mmol/L   BUN 20 9 - 23 mg/dL   Creatinine 9.16 9.44 - 1.02 mg/dL   BUN/Creatinine Ratio 24    eGFR CKD-EPI (2021) Female 69 >=60 mL/min/1.30m2   Glucose 108 70 - 179 mg/dL   Calcium  8.9 8.7 - 10.4 mg/dL   Albumin 3.5 3.4 - 5.0 g/dL   Total Protein 6.5 5.7 - 8.2 g/dL   Total Bilirubin 0.2 (L) 0.3 - 1.2 mg/dL   AST 22 <=65 U/L   ALT 19 10 - 49 U/L   Alkaline Phosphatase 110 46 - 116 U/L  Magnesium  Level  Result Value Ref Range   Magnesium  1.9 1.6 - 2.6 mg/dL  CEA  Result Value  Ref Range   CEA 16.8 (H) 0.0 - 5.0 ng/mL  CBC w/ Differential  Result Value Ref Range   WBC 7.7 3.6 - 11.2 10*9/L   RBC 3.22 (L) 3.95 - 5.13 10*12/L   HGB 9.4 (L) 11.3 - 14.9 g/dL   HCT 71.6 (L) 65.9 - 55.9 %   MCV 87.6 77.6 - 95.7 fL   MCH 29.1 25.9 - 32.4 pg   MCHC 33.2 32.0 - 36.0 g/dL   RDW 83.8 (H) 87.7 - 84.7 %   MPV 8.1 6.8 - 10.7 fL   Platelet 257 150 - 450 10*9/L   Neutrophils % 72.7 %   Lymphocytes % 15.7 %   Monocytes % 5.3 %   Eosinophils % 5.4 %   Basophils % 0.9 %   Absolute Neutrophils 5.6 1.8 - 7.8 10*9/L   Absolute Lymphocytes 1.2 1.1 - 3.6 10*9/L   Absolute Monocytes 0.4 0.3 - 0.8 10*9/L   Absolute Eosinophils 0.4 0.0 - 0.5 10*9/L   Absolute Basophils 0.1 0.0 - 0.1 10*9/L   Anisocytosis Slight (A) Not Present   RADIOLOGY RESULTS  CT Chest Wo Contrast Narrative: EXAM: CT CHEST WO CONTRAST ACCESSION: [id number] [facility] REPORT DATE: 09/03/2023 10:54 AM  CLINICAL INDICATION: restaging  - C25.3 - Adenocarcinoma of pancreatic duct   TECHNIQUE: Contiguous noncontrast axial images were reconstructed through the chest following a single breath hold helical acquisition. Images were reformatted in the axial. coronal, and sagittal planes. MIP slabs were also constructed.  COMPARISON: Chest CT 06/27/2023  FINDINGS:  LUNGS/AIRWAYS/PLEURA: Trachea and large airways are patent. Subpleural micronodule apical right upper lobe unchanged dating back to at least August 2019 (series 5 image 37).Unchanged 0.4 cm lateral left lower lobe micronodule (series 5 image 91). Additional micronodules bilaterally are stable in size dating back to August 2019. No new or enlarging pulmonary nodules. No pleural effusion or pneumothorax.  MEDIASTINUM/THORACIC INLET: Stable subcentimeter mediastinal lymph nodes, no enlarged supraclavicular or intrathoracic lymph nodes. No mediastinal mass or thyroid abnormality.  HEART/VASCULATURE: Cardiac chambers are normal in size. Moderate coronary artery  calcifications. No pericardial effusion. Ascending thoracic aortic 4.2 cm in maximum orthogonal dimension (screenshot), moderate aortic calcifications. Main pulmonary artery is normal in size. Common origin brachiocephalic and left common carotid arteries.  UPPER ABDOMEN: Reported separately.  CHEST WALL/BONES: No enlarged axillary lymph nodes. Diffuse osseous demineralization. Multilevel degenerative changes of the spine. No suspicious lytic or sclerotic osseous lesions.  DEVICES: Right chest port with tip terminating in the mid SVC. Impression: No evidence of new or worsening thoracic metastasis.  Scattered pulmonary micronodules are unchanged. CT Abdomen Pelvis W Contrast Narrative: EXAM: CT ABDOMEN PELVIS W CONTRAST ACCESSION: [id number] [facility] REPORT DATE: 09/03/2023 10:54 AM CLINICAL INDICATION: 78 years old with assess treatment response  - C25.3 - Adenocarcinoma  of pancreatic duct  COMPARISON: CT abdomen pelvis 06/27/2023.  TECHNIQUE: A helical CT scan of the abdomen and pelvis was obtained following IV contrast from the lung bases through the pubic symphysis. Images were reconstructed in the axial plane. Coronal and sagittal reformatted images were also provided for further evaluation.   FINDINGS:  LOWER CHEST: Please see dedicated CT chest for characterization of findings above the diaphragm.  LIVER: Numerous ill-defined hypoattenuating hepatic lesions, many of which are new and/or increased in size compared to prior. For reference: --Left hepatic dome measuring 1.8 cm, similar to prior (2:18) --Hepatic segment 5 measuring 2.8 cm, previously 1.6 cm (2:62) --1.2 cm hepatic segment 7 lesion, new from prior (2:23)  BILIARY: Cholecystectomy. Interval worsening of asymmetric left intrahepatic biliary ductal dilatation. Persistent small volume pneumobilia.    SPLEEN: Normal in size and contour. Splenule.  PANCREAS: Status post Whipple procedure. There is overall worsened ill-defined  soft tissue along the surgical bed and extending into the porta hepatis with encasement of the SMA/SMV, and increasing mass effect on the proximal SMV near the portal splenic confluence, which is now severely narrowed. Increased encasement of the common hepatic/proper hepatic and proximal left and right hepatic arteries. Abutment of the infrarenal IVC and aorta (2:48), similar to prior. Fluid collections adjacent to the pancreaticojejunostomy measures 2.0 cm, previously 3.2 cm and 2.1 cm, previously 3.4 cm. Diffuse atrophy of the remaining pancreatic parenchyma. Similar dilated main pancreatic duct measuring up to 0.6 cm.  ADRENAL GLANDS: Normal right adrenal gland. Unchanged 1.4 cm left adrenal nodule.  KIDNEYS/URETERS: Symmetric renal enhancement. No hydronephrosis. Nonobstructing bilateral renal calculi. No solid renal mass. Subcentimeter hypodensities too small to characterize but likely cysts.  BLADDER: Decompressed and not well assessed due to streak artifact.  REPRODUCTIVE ORGANS: Calcified uterine fibroids.  GI TRACT: Status post Whipple procedure. No findings of bowel obstruction. No active bowel inflammation. Colonic diverticulosis. Normal appendix.  PERITONEUM, RETROPERITONEUM AND MESENTERY: No free air. Trace perihepatic ascites. Worsened mesenteric stranding with a few small anterior peritoneal nodules (2:32, 2:36), not clearly visualized on prior.  LYMPH NODES: Prominent subcentimeter mesenteric and retroperitoneal lymph nodes, for example a left periaortic lymph node measuring 0.6 cm short axis (2:37), previously 0.7 cm.  VESSELS: Hepatic and portal veins are patent. Mild atherosclerosis. Additional findings as above.  BONES and SOFT TISSUES: Mild degenerative changes. No aggressive osseous lesions. Bilateral hip arthroplasties. Postsurgical change of the anterior abdominal wall. Impression: Since 06/27/2023:  --Evidence of progression of disease including new/enlarging hepatic  metastases and worsening soft tissue density in the post Whipple surgical bed extending into the porta hepatis with vascular encasement as described in the body of the report and worsened now severe narrowing of the SMV near the portal splenic confluence.  --Worsened mesenteric stranding with a few tiny anterior peritoneal nodules, which could represent early peritoneal carcinomatosis. Recommend close attention on follow-up  --Fluid collections at the post Whipple surgical site are overall decreased in size compared to prior.  --Mildly worsened left-sided intrahepatic biliary ductal dilatation.     CARE TEAM   [facility]: Dr. Carleton Ip  FOLLOW UP   Future Appointments  Date Time Provider Department Center  09/11/2023 11:45 AM ONCINF CHAIR 29 [id number]  09/25/2023 10:00 AM ADULT ONC LAB [facility]  09/25/2023 11:00 AM Mitchel France, MD [id number]  09/25/2023  1:30 PM ONCINF CHAIR 29 [id number]  10/09/2023 10:30 AM ADULT ONC LAB [facility]  10/09/2023 11:30 AM  Jia, Jingquan, MD [id number]  10/09/2023  1:00 PM ONCINF CHAIR 32 [id number]  10/23/2023 10:30 AM ADULT ONC LAB [facility]  10/23/2023 11:30 AM Mitchel France, MD [id number]  10/23/2023 12:30 PM ONCINF CHAIR 15 [id number]  11/06/2023  9:30 AM ADULT ONC LAB [facility]  11/06/2023 11:00 AM ONCINF CHAIR 15 [id number]

## 2023-11-20 NOTE — Progress Notes (Signed)
 [facility] RETURN VISIT     PATIENT IDENTIFICATION  Victoria Flynn is a 78 y.o. year old female with a PMH significant for HTN, HLD, hypothyroidism, OSA, depression, prediabetes, emphysema, psoriasis, arthritis and pancreatic adenocarcinoma c/b malignant biliary obstruction s/p ERCP and CBD stent placement s/p 25m neoadjuvant Gem/Abraxane followed by Whipple resection on 02/08/23 followed by 31m of adjuvant gem/abraxane with recurrence treated with FOLFIRI with PD, now on FOLFOX.    CANCER STAGING   Hematology/Oncology History  Adenocarcinoma of pancreatic duct    (CMS-HCC)  09/19/2022 -  Cancer Staged   Staging form: Exocrine Pancreas, AJCC 8th Edition - Clinical stage from 09/19/2022: Stage IA (cT1c, cN0, cM0) - Signed by Jia, Jingquan, MD on 10/16/2022    10/16/2022 Initial Diagnosis   Pancreatic adenocarcinoma (CMS-HCC)     ONCOLOGIC HISTORY   Pancreatic adenocarcinoma, resectable 09/12/22, presented to PCP with epigastric pain/fulness and new onset jaundice. Labs notable for AST 213, ALT 282, Alk Phos 373, Tbili 11.3. CT AP showed s/p cholecystectomy. Marked intra and extrahepatic biliary dilatation with interval pancreatic ductal dilatation, concerning for biliary obstruction. Possible ill-defined hypodense  mass at the uncinate process of pancreas though there is ill-defined soft tissue density as well in the anticipated region of the ampulla. B/l nonobstructing kidney stones. Large uterine fibroid. 14 mm L adrenal nodule containing fat c/w small myelolipoma.  09/18/22, MRCP showed marked diffuse intra and extra biliary ductal dilatation measuring up to 2 cm and the CBD with abrupt narrowing at the pancreatic head. Hypoenhancing, diffusion restriction uncinate process mass  measuring 1.1 x 1.1 cm with upstream dilatation of the main pancreatic duct. The SMA, celiac axis and common hepatic arteries are not involved. Less than 180 degrees abutment of the SMV. No metastatic disease  noted.  CT CAP dual panc protocol showed numerous b/l pulmonary micronodules. 09/19/22, EUS showed a mass in the uncinate process of the pancreas. Biopsy of the mass showed adenocarcinoma. ERCP showed a localized abnormal mucosal changes in the 2nd portion of the duodenum lateral and proximal to the ampulla c/f tumor invasion. A single severe biliary stricture was found in the lower third of the main bile duct with marked upstream dilation. The stricture was malignant appearing. Biliary sphincterotomy peformed. Fully covered metal stent placed in CBD.  Course c/b post ERCP pancreatitis.  09/22/22, Tbili 4.4 mg/dL, AST 49, ALT 893, Alk Phos 241 10/17/22, initial consultation with [facility] 10/27/22, Initiated on Gem/Abraxane with abraxane dose reduced to 100 mg/m2; CA 19-9=306.1 01/05/23, C3D15 Gem/Abraxane. CA 19-9=43.27 02/08/23, Whipple surgery. Post op course c/b intra-abdominal infection/abscess requiring pigtail train on IR drain placement and antibiotics. Final path showed a 5 cm moderately differentiated ductal adenocarcinoma in the pancreatic head extending to duodenal wall and peripancreatic soft tissue, partial treatment response, +LVI, +PNI, invasive carcinoma present at SMV margin, low grade PanIN present at pancreatic margin, 6/16 LNs involved; ypT3N2  02/22/23, CT AP w/ contrast showed improvement in L hepatic abscess without apparent evidence of recurrence/metastasis.  04/06/23, Gem/Abraxane resumed. CA 19-9=431.4 06/15/23, completed 37m of adjuvant gem/abraxane   06/27/23, CT CAP showed multiple ill-defined hypoenhancing bilobar hepatic lesions c/f metastatic disease. S/p Whipple with enlarging soft tissue density with vascular encasement as well as new and enlarging fluid collections at the surgical site c/f residual/recurrent malignancy. CA 19-9=1486.71 07/08/23, DPYD activity score=2 07/10/23, Initiate FOLFIRI with 5FU at 2000 mg/m2 and irinotecan at 150 mg/m2 for better tolerability; CA  19-9=3276.93 08/10/23, CA 19-9=5327.52 09/03/23, CT CAP  showed new/enlarging hepatic metastasis and worsening soft tissue density in surgical bed c/w PD. Worsened mesenteric stranding with a few tiny anterior peritoneal nodules could represent early peritoneal carcinomatosis.  09/11/23, Initiated on FOLFOX; CA 19-9=2111.90 10/08/23, CA 19-9=1952.56 11/06/23, 5FU DR'ed by ~15% due to fatigue; CA 19-9=8757.46 11/14/23, CT CAP showed improvement in hepatic metastasis but worsening peritoneum carcinomatosis.   MOLECULAR DIAGNOSTICS   [facility] xT   ASSESSMENT AND PLAN    # Pancreatic adenocarcinoma, resectable. S/p 11m neoadjuvant Gem/Abraxane followed by Whipple resection on 02/08/23 followed by 47m of adjuvant gem/abraxane unfortunately with local disease recurrence and metastasis to the liver immediately after adjuvant Gem/Abraxane. Now s/p FOLFIRI with PD followed by FOLFOX.   Unfortunately her CA 19-9 has had a marked increase and her CT showed worsening peritoneum carcinomatosis, which is consistent with worsening abdominal bloating and diarrhea. Meanwhile, her PS has gradually declined and she states that she has more bad days than good days.   Discussed stopping chemotherapy and focusing on quality of life through supportive measures via home hospice. Patient and families are in agreement.   PLAN - Chemotherapy discontinued - Home hospice referral and initiate hospice after EGD  # Melena: acute onset. Hgb today downtrended to 8 (from 9), c/f upper GIB. - Stop aspirin - Start Protonix  40 mg po twice daily; Rx sent - Patient advised to avoid NSAIDs including ibuprofen, Advil, Aleve, Motrin ect.  - 1u pRBC transfusion given worsening fatigue   Patient and family member's questions answered to their satisfaction. This was high medical decision making on the basis of advanced cancer and treatment with risk for complication requiring ongoing careful monitoring for severe toxicity.   I spent a  total of 55 minutes in both face-to-face and non-face-to-face activities for this visit on the date of this encounter.  All documented time was specific to the E/M visit and does not include any procedures that may have been performed.     INTERVAL HISTORY   History of Present Illness Victoria Flynn is an 78 year old female with recurrent pancreatic cancer who presents with black stools and fatigue. She is accompanied by her son and her husband.   She has been experiencing gastrointestinal symptoms, including gas, bloating, diarrhea, constipation, and abdominal pain. She describes a sensation of 'crumbling of the intestines,' which has improved since discontinuing a nighttime laxative.  This morning, she had a bowel movement that was 'black, pure black' and floated in the toilet. She recalls a similar occurrence a few days ago, though only part of the stool was black at that time. She denies taking iron supplements.  She experiences consistent fatigue and tiredness. No dizziness, lightheadedness, shortness of breath, or chest pain. She states she has more bad days than good days.     REVIEW OF SYSTEMS  The remainder of a comprehensive 10 systems review is negative.   Allergies and Medications reviewed as per chart  PHYSICAL EXAMS   Vitals:   11/20/23 0957  BP: 175/83  Pulse: 57  Resp: 14  Temp: 36.1 C (96.9 F)  SpO2: 98%   Pulse 61 on manual recheck        11/20/23 65.2 kg (143 lb 11.8 oz)  11/06/23 66 kg (145 lb 8.1 oz)  10/23/23 66.4 kg (146 lb 4.8 oz)    ECOG: (2) Ambulatory and capable of self care, unable to carry out work activity, up and about > 50% or waking hours  GENERAL: elderly female, pale appearing, in no  distress PSYCH: full and appropriate range of affect with good insight and judgement HEENT: NCAT, pupils equal, sclerae anicteric  EXT: warm and well perfused, no edema SKIN: no rashes  OBJECTIVE DATA  LABS:  Lab Results  Component Value Date   WBC 5.9  11/20/2023   HGB 8.3 (L) 11/20/2023   HCT 24.8 (L) 11/20/2023   PLT 172 11/20/2023    Lab Results  Component Value Date   NA 144 11/06/2023   K 3.5 11/06/2023   CL 104 11/06/2023   CO2 27.0 11/06/2023   BUN 14 11/06/2023   CREATININE 0.72 11/06/2023   GLU 178 11/06/2023   CALCIUM  8.7 11/06/2023   MG 1.8 11/06/2023    Lab Results  Component Value Date   BILITOT 0.5 11/06/2023   PROT 6.4 11/06/2023   ALBUMIN 3.3 (L) 11/06/2023   ALT 22 11/06/2023   AST 26 11/06/2023   ALKPHOS 119 (H) 11/06/2023   RADIOLOGY RESULTS  None today  CARE TEAM   [facility]: Dr. Carleton Ip  FOLLOW UP   Future Appointments  Date Time Provider Department Center  11/20/2023 12:15 PM ONCINF CHAIR 41 [id number]  12/03/2023 11:45 AM [facility]  12/04/2023  9:30 AM Mitchel France, MD [id number]  12/04/2023 10:45 AM ONCINF CHAIR 30 [id number]  12/17/2023 10:00 AM [facility] [address] [facility]  12/18/2023  9:15 AM ONCINF CHAIR 40 [id number]  12/18/2023 10:00 AM Everardo Back, ANP [id number]

## 2023-11-20 NOTE — Progress Notes (Signed)
 Blood and Blood Product Transfusion Consent   Informed consent obtained from Victoria Flynn for blood transfusion products. The  risks and benefits for receiving blood transfusion products were explained to the patient including but not limited to infection (bacterial or viral) and/or transfusion reactions. The patient gives consent to receive blood transfusion products as ordered and consent will remain on file according to hospital standards.  I mentioned that the patient can decide to stop transfusions at any time. All of the patient's questions were answered to her satisfaction. Victoria Flynn is alert and oriented, expressed understanding, and consented to proceed with the proposed treatment plan.     ZAKEYA NICHOLS, ANP [facility] PROVIDER FOR INFUSION PROGRAM

## 2023-11-20 NOTE — Progress Notes (Signed)
 Spoke with patient at chairside regarding hospice referral. Patient states she had [facility] come out a few months ago to see her and she would like to use them for home hospice. She also needs an EGD. Called [facility] in [facility]; [address] [place], [place] [address] Ph: [phone]) and was given fax number to send referral: [phone]. Also gave callback number. Per [facility], patient cannot start hospice until after EGD is completed (scheduled for 10/2). Spoke with Dianna with [facility]- they are able to start patient ASAP. Met with patient in infusion clinic, advised that [facility] can admit her tonight, but if she wants to use [facility] she will have to wait until after EGD procedure. Patient states she prefers to wait and use [facility]. Will go ahead and send in referral today but will indicate EGD is 10/2 on referral.   Faxed hospice order, OV note, and demographics to [facility] at [phone]. Fax confirmation received.

## 2023-12-22 DEATH — deceased
